# Patient Record
Sex: Male | Born: 1945 | Race: White | Hispanic: No | Marital: Married | Smoking: Former smoker
Health system: Southern US, Community
[De-identification: ages and names within clinical notes are randomized; demographics above are authoritative.]

## PROBLEM LIST (undated history)

## (undated) DIAGNOSIS — Z872 Personal history of diseases of the skin and subcutaneous tissue: Secondary | ICD-10-CM

## (undated) DIAGNOSIS — M1990 Unspecified osteoarthritis, unspecified site: Secondary | ICD-10-CM

## (undated) DIAGNOSIS — E785 Hyperlipidemia, unspecified: Secondary | ICD-10-CM

## (undated) DIAGNOSIS — R51 Headache: Secondary | ICD-10-CM

## (undated) HISTORY — DX: Headache: R51

## (undated) HISTORY — DX: Hyperlipidemia, unspecified: E78.5

## (undated) HISTORY — DX: Personal history of diseases of the skin and subcutaneous tissue: Z87.2

## (undated) HISTORY — DX: Unspecified osteoarthritis, unspecified site: M19.90

## (undated) HISTORY — PX: VASECTOMY: SHX75

---

## 2000-10-30 ENCOUNTER — Inpatient Hospital Stay: Admission: EM | Admit: 2000-10-30 | Discharge: 2000-10-31 | Payer: Self-pay | Admitting: Emergency Medicine

## 2000-10-30 ENCOUNTER — Encounter: Payer: Self-pay | Admitting: Emergency Medicine

## 2001-10-14 ENCOUNTER — Encounter: Payer: Self-pay | Admitting: Gastroenterology

## 2002-04-29 ENCOUNTER — Encounter: Admission: RE | Admit: 2002-04-29 | Discharge: 2002-04-29 | Payer: Self-pay | Admitting: Family Medicine

## 2002-04-29 ENCOUNTER — Encounter: Payer: Self-pay | Admitting: Family Medicine

## 2006-12-29 ENCOUNTER — Ambulatory Visit: Payer: Self-pay | Admitting: Gastroenterology

## 2007-01-05 ENCOUNTER — Encounter: Payer: Self-pay | Admitting: Gastroenterology

## 2007-01-05 ENCOUNTER — Ambulatory Visit: Payer: Self-pay | Admitting: Gastroenterology

## 2010-02-26 ENCOUNTER — Encounter: Payer: Self-pay | Admitting: Gastroenterology

## 2010-03-05 ENCOUNTER — Encounter (INDEPENDENT_AMBULATORY_CARE_PROVIDER_SITE_OTHER): Payer: Self-pay | Admitting: *Deleted

## 2010-04-25 ENCOUNTER — Ambulatory Visit: Payer: Self-pay | Admitting: Gastroenterology

## 2010-04-25 DIAGNOSIS — Z860100 Personal history of colon polyps, unspecified: Secondary | ICD-10-CM | POA: Insufficient documentation

## 2010-04-25 DIAGNOSIS — R195 Other fecal abnormalities: Secondary | ICD-10-CM

## 2010-04-26 ENCOUNTER — Ambulatory Visit: Payer: Self-pay | Admitting: Gastroenterology

## 2010-09-18 NOTE — Letter (Signed)
Summary: [facility] Instructions  [facility]  [address] [place], [place] [address]   Phone: [phone]  Fax: [phone]       MILEN LENGACHER    Apr 10, 1946    MRN: [id number]        Procedure Day /Date:Thursday September 8th, 2011     Arrival Time: 8:30am      Procedure Time: 9:30am     Location of Procedure:                    _x _  [facility] (4th Floor)                        PREPARATION FOR COLONOSCOPY WITH MOVIPREP    THE DAY BEFORE YOUR PROCEDURE         DATE: 04/25/10  DAY: Today  1.  Drink clear liquids the entire day-NO SOLID FOOD  2.  Do not drink anything colored red or purple.  Avoid juices with pulp.  No orange juice.  3.  Drink at least 64 oz. (8 glasses) of fluid/clear liquids during the day to prevent dehydration and help the prep work efficiently.  CLEAR LIQUIDS INCLUDE: Water Jello Ice Popsicles Tea (sugar ok, no milk/cream) Powdered fruit flavored drinks Coffee (sugar ok, no milk/cream) Gatorade Juice: apple, white grape, white cranberry  Lemonade Clear bullion, consomm, broth Carbonated beverages (any kind) Strained chicken noodle soup Hard Candy                             4.  In the morning, mix first dose of MoviPrep solution:    Empty 1 Pouch A and 1 Pouch B into the disposable container    Add lukewarm drinking water to the top line of the container. Mix to dissolve    Refrigerate (mixed solution should be used within 24 hrs)  5.  Begin drinking the prep at 5:00 p.m. The MoviPrep container is divided by 4 marks.   Every 15 minutes drink the solution down to the next mark (approximately 8 oz) until the full liter is complete.   6.  Follow completed prep with 16 oz of clear liquid of your choice (Nothing red or purple).  Continue to drink clear liquids until bedtime.  7.  Before going to bed, mix second dose of MoviPrep solution:    Empty 1 Pouch A and 1 Pouch B into the disposable container    Add  lukewarm drinking water to the top line of the container. Mix to dissolve    Refrigerate  THE DAY OF YOUR PROCEDURE      DATE: 04/26/10 DAY: Thursday  Beginning at 4:30 a.m. (5 hours before procedure):         1. Every 15 minutes, drink the solution down to the next mark (approx 8 oz) until the full liter is complete.  2. Follow completed prep with 16 oz. of clear liquid of your choice.    3. You may drink clear liquids until 7:30am  (2 HOURS BEFORE PROCEDURE).   MEDICATION INSTRUCTIONS  Unless otherwise instructed, you should take regular prescription medications with a small sip of water   as early as possible the morning of your procedure.         OTHER INSTRUCTIONS  You will need a responsible adult at least 65 years of age to accompany you and drive you home.   This  person must remain in the waiting room during your procedure.  Wear loose fitting clothing that is easily removed.  Leave jewelry and other valuables at home.  However, you may wish to bring a book to read or  an iPod/MP3 player to listen to music as you wait for your procedure to start.  Remove all body piercing jewelry and leave at home.  Total time from sign-in until discharge is approximately 2-3 hours.  You should go home directly after your procedure and rest.  You can resume normal activities the  day after your procedure.  The day of your procedure you should not:   Drive   Make legal decisions   Operate machinery   Drink alcohol   Return to work  You will receive specific instructions about eating, activities and medications before you leave.    The above instructions have been reviewed and explained to me by   Marchelle Folks.     I fully understand and can verbalize these instructions _____________________________ Date _________

## 2010-09-18 NOTE — Procedures (Signed)
Summary: Colonoscopy  Patient: Scott Ayala Note: All result statuses are Final unless otherwise noted.  Tests: (1) Colonoscopy (COL)   COL Colonoscopy           DONE     [facility]     [address]     [place], [place]  [address]           COLONOSCOPY PROCEDURE REPORT           PATIENT:  [name], [name]  MR#:  [id number]     BIRTHDATE:  February 14, 1946, 63 yrs. old  GENDER:  male     ENDOSCOPIST:  Judie Petit T. Russella Dar, MD, [facility]           PROCEDURE DATE:  04/26/2010     PROCEDURE:  Colonoscopy [id number]     ASA CLASS:  Class II     INDICATIONS:  1) FOBT positive stool  2) follow-up of polyp,     adenomatous polyp, 12/2006.     MEDICATIONS:   Fentanyl 75 mcg IV, Versed 7 mg IV     DESCRIPTION OF PROCEDURE:   After the risks benefits and     alternatives of the procedure were thoroughly explained, informed     consent was obtained.  Digital rectal exam was performed and     revealed no abnormalities.   The LB [id number]-[id number] endoscope     was introduced through the anus and advanced to the cecum, which     was identified by both the appendix and ileocecal valve, without     limitations.  The quality of the prep was excellent, using     MoviPrep.  The instrument was then slowly withdrawn as the colon     was fully examined.     <<PROCEDUREIMAGES>>     FINDINGS:  Mild diverticulosis was found in the sigmoid colon.  A     normal appearing cecum, ileocecal valve, and appendiceal orifice     were identified. The ascending, hepatic flexure, transverse,     splenic flexure, descending colon, and rectum appeared     unremarkable. Retroflexed views in the rectum revealed internal     hemorrhoids, small.  The time to cecum =  3.25  minutes. The scope     was then withdrawn (time =  9  min) from the patient and the     procedure completed.     COMPLICATIONS:  None           ENDOSCOPIC IMPRESSION:     1) Mild diverticulosis in the sigmoid colon     2) Internal hemorrhoids        RECOMMENDATIONS:     1) High fiber diet with liberal fluid intake.     2) Repeat Colonoscopy in 5 years.           Venita Lick. Russella Dar, MD, Clementeen Graham           CC: [name], MD           n.     Rosalie DoctorVenita Lick. Stark at 04/26/2010 10:16 AM           Mattheu, [place], [phone]  Note: An exclamation mark (!) indicates a result that was not dispersed into the flowsheet. Document Creation Date: 04/26/2010 10:17 AM _______________________________________________________________________  (1) Order result status: Final Collection or observation date-time: 04/26/2010 10:13 Requested date-time:  Receipt date-time:  Reported date-time:  Referring Physician:   Ordering Physician: Claudette Head [phone]) Specimen Source:  Source: Launa Grill Order Number: [id number] Lab site:   Appended Document: Colonoscopy    Clinical Lists Changes  Observations: Added new observation of COLONNXTDUE: 04/2015 (04/26/2010 13:05)

## 2010-09-18 NOTE — Assessment & Plan Note (Signed)
Summary: hemo pos/lk   History of Present Illness Visit Type: Initial Consult Primary GI MD: Elie Goody MD [facility] Primary Provider: Elnoria Howard, MD Requesting Provider: [name], MD Chief Complaint: Hem positive stool History of Present Illness:   This is a 65 year old male with a prior history of adenomatous colon polyps. Last colonoscopy in May 2008. He also has diverticulosis and hemorrhoids. Recently was found to have heme + stool on a digital rectal. He has no ongoing gastrointestinal complaints.   GI Review of Systems      Denies abdominal pain, acid reflux, belching, bloating, chest pain, dysphagia with liquids, dysphagia with solids, heartburn, loss of appetite, nausea, vomiting, vomiting blood, weight loss, and  weight gain.      Reports diarrhea, diverticulosis, heme positive stool, and  hemorrhoids.     Denies anal fissure, black tarry stools, change in bowel habit, constipation, fecal incontinence, irritable bowel syndrome, jaundice, light color stool, liver problems, rectal bleeding, and  rectal pain.   Current Medications (verified): 1)  Proair Hfa 108 (90 Base) Mcg/act Aers (Albuterol Sulfate) .... Once Daily 2)  Red Yeast Rice 600 Mg Tabs (Red Yeast Rice Extract) .... 2 Tablets By Mouth Once Daily 3)  Pulmicort Flexhaler 180 Mcg/act Aepb (Budesonide) .... 2 Puffs Every 4 Hours As Needed  Allergies (verified): 1)  ! 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 Vasectomy Tonsillectomy  Family History: Family History of Colon Cancer:MGF, Maternal Aunts Family History of Heart Disease: Father ,Brothers x2, Mother  Social History: Reviewed history from 04/20/2010 and no changes required. Married Clinical research associate Patient  has never smoked.  Alcohol Use - yes Illicit Drug Use - no Patient gets regular exercise. Daily Caffeine Use 4-6  Review of Systems       The patient complains of arthritis/joint pain and shortness of breath.         The pertinent positives and negatives are noted as above and in the HPI. All other ROS were reviewed and were negative.   Vital Signs:  Patient profile:   65 year old male Height:      68 inches Weight:      203.25 pounds BMI:     31.02 Pulse rate:   72 / minute Pulse rhythm:   regular BP sitting:   108 / 80  (left arm) Cuff size:   regular  Vitals Entered By: June McMurray CMA Duncan Dull) (April 25, 2010 9:40 AM)  Physical Exam  General:  Well developed, well nourished, no acute distress. Head:  Normocephalic and atraumatic. Eyes:  PERRLA, no icterus. Ears:  Normal auditory acuity. Mouth:  No deformity or lesions, dentition normal. Neck:  Supple; no masses or thyromegaly. Chest Wall:  Symmetrical,  no deformities . Lungs:  Clear throughout to auscultation. Heart:  Regular rate and rhythm; no murmurs, rubs,  or bruits. Abdomen:  Soft, nontender and nondistended. No masses, hepatosplenomegaly or hernias noted. Normal bowel sounds. Rectal:  Normal exam.deferred until time of colonoscopy.   Msk:  Symmetrical with no gross deformities. Normal posture. Pulses:  Normal pulses noted. Extremities:  No clubbing, cyanosis, edema or deformities noted. Neurologic:  Alert and  oriented x4;  grossly normal neurologically. Cervical Nodes:  No significant cervical adenopathy. Inguinal Nodes:  No significant inguinal adenopathy. Psych:  Alert and cooperative. Normal mood and affect.  Impression &  Recommendations:  Problem # 1:  NONSPECIFIC ABNORMAL FINDING IN STOOL CONTENTS (ICD-792.1) Asymptomatic Hemoccult-positive stool. Rule out colorectal neoplasms, hemorrhoids. The risks, benefits and alternatives to colonoscopy with possible biopsy and possible polypectomy were  discussed with the patient and they consent to proceed. The procedure will be scheduled electively. Orders: Colonoscopy (Colon)  Problem # 2:  PERSONAL HISTORY OF COLONIC POLYPS (ICD-V12.72) Prior history of adenomatous colon polyps, initially diagnosed in 12/2006. Orders: Colonoscopy (Colon)  Patient Instructions: 1)  Colonoscopy brochure given.  2)  Pick up your prep from your pharmacy.  3)  Copy sent to : [name], MD 4)  The medication list was reviewed and reconciled.  All changed / newly prescribed medications were explained.  A complete medication list was provided to the patient / caregiver.  Prescriptions: MOVIPREP 100 GM  SOLR (PEG-KCL-NACL-NASULF-NA ASC-C) As per prep instructions.  #1 x 0   Entered by:   Christie Nottingham CMA (AAMA)   Authorized by:   Meryl Dare MD [facility]   Signed by:   Meryl Dare MD [facility] on 04/25/2010   Method used:   Electronically to        [facility]  [address] [phone]* (retail)       [address]       [place], [place]  [address]       Ph: [phone] or [phone]       Fax: [phone]   RxID:   [id number]

## 2010-09-18 NOTE — Procedures (Signed)
Summary: Colonoscopy and biopsy   Colonoscopy  Procedure date:  01/05/2007  Findings:      Results: Hemorrhoids.     Results: Diverticulosis.       Pathology:  Adenomatous polyp.        Location:  [facility].    Procedures Next Due Date:    Colonoscopy: 01/2012 Patient Name: Scott, Ayala MRN:  Procedure Procedures: Colonoscopy CPT: [phone].    with biopsy. CPT: [id number].  Personnel: Endoscopist: Venita Lick. Russella Dar, MD, Clementeen Graham.  Exam Location: Exam performed in Outpatient Clinic. Outpatient  Patient Consent: Procedure, Alternatives, Risks and Benefits discussed, consent obtained, from patient. Consent was obtained by the RN.  Indications  Increased Risk Screening: For family history of colorectal neoplasia, in   Comments: MGM and several M aunts with colon cancer. History  Current Medications: Patient is not currently taking Coumadin.  Pre-Exam Physical: Performed Jan 05, 2007. Cardio-pulmonary exam, Rectal exam, HEENT exam , Abdominal exam, Mental status exam WNL.  Comments: Pt. history reviewed/updated, physical exam performed prior to initiation of sedation?Yes Exam Exam: Extent of exam reached: Cecum, extent intended: Cecum.  The cecum was identified by appendiceal orifice and IC valve. Time to Cecum: 00:05. Time for Withdrawl: 00:09:01. Colon retroflexion performed. ASA Classification: II. Tolerance: excellent.  Monitoring: Pulse and BP monitoring, Oximetry used. Supplemental O2 given.  Colon Prep Used MoviPrep for colon prep. Prep results: excellent.  Sedation Meds: Patient assessed and found to be appropriate for moderate (conscious) sedation. Fentanyl 75 mcg. given IV. Versed 8 mg. given IV.  Findings NORMAL EXAM: Hepatic Flexure.  NORMAL EXAM: Splenic Flexure to Descending Colon.  POLYP: Transverse Colon, Maximum size: 2 mm. sessile polyp. Procedure:  biopsy without cautery, removed, retrieved, Polyp sent to pathology. ICD9: Colon Polyps:  211.3.  DIVERTICULOSIS: Ascending Colon. Not bleeding. ICD9: Diverticulosis: 562.10.  - DIVERTICULOSIS: Sigmoid Colon. Not bleeding. ICD9: Diverticulosis: 562.10.  NORMAL EXAM: Cecum.  HEMORRHOIDS: Internal. Size: Small. Not bleeding. Not thrombosed. ICD9: Hemorrhoids, Internal: 455.0.   Assessment  Diagnoses: 562.10: Diverticulosis.  211.3: Colon Polyps.  455.0: Hemorrhoids, Internal.   Events  Unplanned Interventions: No intervention was required.  Unplanned Events: There were no complications. Plans  Post Exam Instructions: Post sedation instructions given.  Medication Plan: Await pathology.  Patient Education: Patient given standard instructions for: Polyps. Diverticulosis. Hemorrhoids.  Disposition: After procedure patient sent to recovery. After recovery patient sent home.  Scheduling/Referral: Colonoscopy, to [facility] T. Russella Dar, MD, [facility], around Jan 05, 2012.    This report was created from the original endoscopy report, which was reviewed and signed by the above listed endoscopist.    cc: Mosetta Putt, MD          SP-Surgical Pathology - STATUS: Final  .                                         Perform Date: [id number] 00:01  Ordered By: Rica Records Date: [id number] 14:27  Facility: [facility]                               Department: CPATH  Service Report Text  [facility]   [address] [address]   [place], [place] [address]   Telephone [phone] or [phone] Fax [phone]  REPORT OF SURGICAL PATHOLOGY    Case #: [id number]   Patient Name: Scott Ayala, Scott Ayala.   Office Chart Number: [id number]    MRN: [id number]   Pathologist: Clearance Coots, MD   DOB/Age February 12, 1946 (Age: 65) Gender: M   Date Taken: 01/05/2007   Date Received: 01/05/2007    FINAL DIAGNOSIS    ***MICROSCOPIC EXAMINATION AND DIAGNOSIS***    COLON, POLYP(S), TRANSVERSE: TUBULAR ADENOMA(S). NO HIGH GRADE   DYSPLASIA OR MALIGNANCY  IDENTIFIED.    mj   Date Reported: 01/06/2007 Clearance Coots, MD   *** Electronically Signed Out By [facility] ***    Clinical information   R/O adenomatous; family hx of colon CA, screening (gt)    specimen(s) obtained   Colon, polyp(s), transverse    Gross Description   Received in formalin are tan, soft tissue fragments that are   submitted in toto. Number: 5.   Size: Less than 0.1 cm to 0.3 cm. One block. (TB:cdc:01/05/07)     cc/   Additional Information  HL7 RESULT STATUS : F  External IF Update Timestamp : 2007-01-05:14:29:00.000000

## 2010-09-18 NOTE — Procedures (Signed)
Summary: Colonoscopy   Colonoscopy  Procedure date:  10/14/2001  Findings:      Results: Hemorrhoids.     Results: Diverticulosis.       Location:  [facility].    Procedures Next Due Date:    Colonoscopy: 10/2006 Patient Name: Scott Ayala, Scott Ayala MRN:  Procedure Procedures: Colonoscopy CPT: [phone].  Personnel: Endoscopist: Venita Lick. Russella Dar, MD, Clementeen Graham.  Referred By: Mosetta Putt, M.D.  Exam Location: Exam performed in Outpatient Clinic. Outpatient  Patient Consent: Procedure, Alternatives, Risks and Benefits discussed, consent obtained, from patient. Consent was obtained by the RN.  Indications  Increased Risk Screening: For family history of colorectal neoplasia, in  grandparent several maternal aunts  Comments: Maternal grandfather and several maternal aunts with colon ca. History  Pre-Exam Physical: Performed Oct 14, 2001. Entire physical exam was normal.  Exam Exam: Extent of exam reached: Cecum, extent intended: Cecum.  The cecum was identified by appendiceal orifice and IC valve. Colon retroflexion performed. ASA Classification: II. Tolerance: good.  Monitoring: Pulse and BP monitoring, Oximetry used. Supplemental O2 given.  Colon Prep Used Golytely for colon prep. Prep results: good.  Sedation Meds: Patient assessed and found to be appropriate for moderate (conscious) sedation. Fentanyl 100 mcg. given IV. Versed 8 mg. given IV.  Findings - DIVERTICULOSIS: Sigmoid Colon. Not bleeding. ICD9: Diverticulosis: 562.10.  NORMAL EXAM: Cecum to Descending Colon.  HEMORRHOIDS: Internal. Size: Small. Not bleeding. Not thrombosed. ICD9: Hemorrhoids, Internal: 455.0.   Assessment  Diagnoses: 562.10: Diverticulosis.  455.0: Hemorrhoids, Internal.   Events  Unplanned Interventions: No intervention was required.  Unplanned Events: There were no complications. Plans Medication Plan: Continue current medications.  Patient Education: Patient  given standard instructions for: Diverticulosis. Hemorrhoids.  Disposition: After procedure patient sent to recovery. After recovery patient sent home.  Scheduling/Referral: Colonoscopy, to [facility] T. Russella Dar, MD, [facility], around Oct 14, 2006.  Referring provider, to Mosetta Putt, M.D.,    This report was created from the original endoscopy report, which was reviewed and signed by the above listed endoscopist.    cc: Mosetta Putt, MD

## 2010-09-18 NOTE — Letter (Signed)
Summary: New Patient letter  [facility]  [address] [place], [place] [address]   Phone: [phone]  Fax: [phone]       03/05/2010 MRN: [id number]  [facility] [address] [place], [place]  [address]  Dear Scott Ayala,  Welcome to the Gastroenterology Division at [facility].    You are scheduled to see Dr.  Russella Dar  on 04/25/2010 at 9:30am on the 3rd floor at [facility], [address]  We ask that you try to arrive at our office 15 minutes prior to your appointment time to allow for check-in.  We would like you to complete the enclosed self-administered evaluation form prior to your visit and bring it with you on the day of your appointment.  We will review it with you.  Also, please bring a complete list of all your medications or, if you prefer, bring the medication bottles and we will list them.  Please bring your insurance card so that we may make a copy of it.  If your insurance requires a referral to see a specialist, please bring your referral form from your primary care physician.  Co-payments are due at the time of your visit and may be paid by cash, check or credit card.     Your office visit will consist of a consult with your physician (includes a physical exam), any laboratory testing he/she may order, scheduling of any necessary diagnostic testing (e.g. x-ray, ultrasound, CT-scan), and scheduling of a procedure (e.g. Endoscopy, Colonoscopy) if required.  Please allow enough time on your schedule to allow for any/all of these possibilities.    If you cannot keep your appointment, please call [phone] to cancel or reschedule prior to your appointment date.  This allows [place] the opportunity to schedule an appointment for another patient in need of care.  If you do not cancel or reschedule by 5 p.m. the business day prior to your appointment date, you will be charged a $50.00 late cancellation/no-show fee.    Thank you for choosing [facility] for your medical needs.  We appreciate the opportunity to care for you.  Please visit [place] at our website  to learn more about our practice.                     Sincerely,                                                             The Gastroenterology Division

## 2011-01-04 NOTE — Discharge Summary (Signed)
[facility]  Patient:    Scott Ayala, Scott Ayala                             MRN: [id number] Adm. Date:  [id number] Disc. Date: [id number] Attending:  Drema Halon                           Discharge Summary  DISCHARGE DIAGNOSES: 1. Asthmatic bronchitis possibly secondary to resolving influenza. 2. Convulsive syncope.  HISTORY OF PRESENT ILLNESS:  This 65 year old male had a three-day history of fever, chills, myalgias, and watery nasal congestion with a dry cough.  He had had some mild frontal headaches and, on the day of admission, had flown back to [place] from [place].  He had had less than a quart of liquids over a 24-hour period.  He presented to the emergency room, was in the waiting area for a couple of hours, and he was seen by the ER physician.  He was evaluated and felt to have a respiratory infection and was going to be sent home on Zithromax.  On standing him to do postural blood pressures, he became light-headed and had a brief syncopal episode of less than a minute with some evidence of tonic-clonic activity.  There were no obvious postictal symptoms. Because of the syncope, Dr. Margrett Rud was called to admit the patient.  PAST MEDICAL HISTORY:  Unremarkable and the patient was not on any medications.  PHYSICAL EXAMINATION AT ADMISSION:  VITAL SIGNS:  Temperature 99.4, blood pressure 124/70, pulse 88, respirations 14.  HEAD/NECK:  Exam was unremarkable except for rhinorrhea.  LUNGS:  Clear.  CARDIAC:  Exam was negative.  ABDOMEN:  Unremarkable.  RECTAL:  Negative with hematest-negative stool.  NEUROMUSCULAR:  Examination was unremarkable.  ADMITTING DIAGNOSIS:  Viral illness with dehydration, postural syncope, and a seizure.  LABORATORY DATA:  Admission CBC showed a hematocrit of 43.8, ______ platelets, and WBCs of 6700 with 45 polys, 44 lymphs, 11 monos, and 1 baso. BMP on admission was normal.  Total CK was elevated at  220 but CK-MB and troponins were negative.  Urinalysis was unremarkable except for minimal urobilinogen.  Blood cultures x 2 were negative.  Admission EKG was consistent with left atrial enlargement and he had nonspecific T-wave changes.  A CT brain scan without contrast was negative except for patchy ethmoid sinus disease.  A chest x-ray was unremarkable.  HOSPITAL COURSE:  Patient was admitted to telemetry which remained normal. Vital signs were obtained.  He was given clear liquids and IV fluids.  He was not given Zithromax since it was felt that he had a viral illness.  His vital signs remained stable.  His O2 saturations were good.  He did demonstrate a fever as high as 100.1 during his hospitalization.  His Is and Os were good. He was seen in neurologic consultation by Dr. Kelli Hope.  It was his impression that the patient had convulsive syncope and did not need further neurologic evaluation or treatment.  Later, on the first hospital day, the patient was improved but he was having a frequent cough.  He had no dizziness and no evidence of any seizure activity.  He was alert and oriented. Examination of his lungs revealed sparse rhonchi in the right base with a few wheezes.  The remainder of examination was unremarkable.  Impression at that point was that patient either had influenza and/or asthmatic  bronchitis and/or early right lower lobe pneumonia.  For that reason, Zithromax was initiated and he was also given Tussionex.  It was felt that he was several days into possible influenza and that treatment for that illness would not be effective for that reason.  By October 31, 2000, the patient was improved.  He was coughing frequently at times.  His chest felt congested.  He was ambulating without difficulty, his vital signs were good, and he was afebrile.  He was alert and oriented.  His respirations were not labored.  Examination of his lungs at that time revealed coarse  bibasilar rales and scattered bibasilar low-pitched wheezes.  Remainder of examination was unremarkable.  It was felt that he was stable enough to be discharged and followed as an outpatient.  DISCHARGE MEDICATIONS: 1. Z-Pak as directed. 2. Tussionex one teaspoon q.12h. p.r.n. for cough. 3. Albuterol inhaler q.4h. p.r.n. chest congestion or wheezing.  He was    instructed in its use.  CONDITION:  Satisfactory and improved.  ACTIVITY:  Up as tolerated at home.  He was cautioned regarding orthostatic dizziness.  He was advised not to work before November 03, 2000.  FOLLOW-UP:  Three to four days in the office by me. DD:  12/21/00 TD:  12/22/00 Job: [id number]

## 2011-01-04 NOTE — Consult Note (Signed)
[facility]  Patient:    Scott Ayala, Scott Ayala                             MRN: [id number] Proc. Date: 10/30/00 Adm. Date:  [id number] Attending:  Molpus, John L                          Consultation Report  DATE OF BIRTH:  Jan 10, 1946  REQUESTING PHYSICIAN:  [facility], and Margrett Rud.  REASON FOR EVALUATION:  Suspected seizure.  HISTORY OF PRESENT ILLNESS:  See initial emergency room consultation evaluation of this 65 year old man with no chronic medical problems who presented to the [facility] Emergency Room last night with a three day history of fever, cough, myalgias, and one day of anorexia and decreased p.o. intake.  He was felt to have a viral syndrome, and received symptomatic treatment.  He was prepared for discharge.  Upon attempting to walk out, complained of lightheadedness.  He was taken to the check out area in a wheelchair.  He says his blood pressure was reportedly checked again out there was low per the patient, although I cannot find this documented anywhere.  He says that they subsequently attempted to check orthostatics, and after he stood for a while he began to feel very dizzy and light-headed.  Per his wifes report, he fell back into the wheelchair, and then became stiff with twitching movements of his extremities and unresponsiveness.  He seemed to have two brief spells, each lasting less than 15 seconds.  He was confused for a few minutes afterwards, but subsequently came back to normal within about 5 minutes.  CT of head was performed, it was negative.  He was subsequently admitted for further evaluation, although he remains in the emergency room awaiting admission.  He was on IV fluids all night, and this morning says he feels much better.  He had a mild headache yesterday, which at present is better.  He denies any neck stiffness.  PAST MEDICAL HISTORY:  No chronic medical problems.  FAMILY HISTORY:  No  history of seizures.  SOCIAL HISTORY:  Does not use tobacco or alcohol.  ALLERGIES:  No known drug allergies.  MEDICATIONS:  No chronic medications.  REVIEW OF SYSTEMS:  Headache as above, no neck stiffness, no focal weakness or numbness or tingling to the extremities, no bowel or bladder symptoms.  No previous history of stroke, head injury, or encephalitis.  Otherwise, per HPI.  PHYSICAL EXAMINATION:  VITAL SIGNS:  Vital signs at 0710.  Temperature 98.7, blood pressure 108/69, pulse 67, respirations 14.  GENERAL:  He is alert and in no evident distress.  He is fully oriented and able to answer questions and follow complex commands.  His speech is fluent and not dysarthric.  CHEST:  Clear to auscultation.  HEART:  Regular rate and rhythm without murmurs.  NECK:  Supple without carotid bruits.  NEUROLOGIC EXAMINATION:  Cranial nerves, funduscopic examination is benign. Pupils are equal and briskly reactive.  Extraocular movements are normal without nystagmus.  Visual fields are full to confrontation.  Face is symmetric and moves normally.  Tongue and palate move normally in the midline. Motor exam:  Normal bulk and tone, no atrophy or fasciculations.  Normal strength in all tested extremity muscles.  Sensation is intact to light touch, vibration, and pinprick in all extremities.  Cerebellar:  Rapid alternating movements  are normal.  Finger-to-nose is performed without dysmetria. Reflexes are 2+ and symmetric.  Gait is normal.  He is able to heel-toe and tandem walk.  He does have mild difficulty with tandem, which he says is due to lightheadedness.  LABORATORY DATA:  CT of the head is personally reviewed, and I agree that it is normal.  EKG reveals no acute changes.  CBC and BMET are unremarkable.  CK is mildly elevated at 220.  IMPRESSION:  Convulsive syncope.  RECOMMENDATIONS:  No further neurologic workup needed.  Recommend only symptomatic treatment of a viral  illness.  This has no major significance in terms of future risk of seizure, and I advised him of such. DD:  10/30/00 TD:  10/30/00 Job: [id number]

## 2011-01-04 NOTE — H&P (Signed)
[facility]  Patient:    Scott Ayala, Scott Ayala                             MRN: [id number] Adm. Date:  [id number] Attending:  Molpus, Carlisle Beers CC:         Carolyne Fiscal, M.D.   History and Physical  PRIMARY CARE PHYSICIAN:  Carolyne Fiscal, M.D.  HISTORY OF PRESENT ILLNESS:  This 65 year old male had a three day history starting on Sunday of fever and chills which were intermittent along with myalgias and watery nasal congestion with dry cough. He had had some mild frontal aching and on the day of admission had flown back from [employer], [place]. He had less than one quart of liquids in the 24-hour period and went to the emergency room at 7:00. He apparently waited in the waiting area for a couple of hours and was seen by emergency room physician with a normal CBC and CMET along with a normal chest x-ray and no focal findings. He was to be discharged on Zithromax for an upper respiratory infection but on standing him up to do postural blood pressures, he felt faint and lightheaded and had a brief less than 1 minute seizure with no significant postictal period. This seizure was generalized with eye rolling and extremity stiffening. He was fine afterwards and I was called at that time to see him.  ALLERGIES:  No known drug allergies.  CURRENT MEDICATIONS:  None.  HOSPITALIZATIONS/OPERATIONS:  Tonsillectomy and adenoidectomy.  FAMILY HISTORY:  Father died of a stroke at age 28 and mother has had a CVA and is living. He has seven brothers and four sisters who are alive without significant health problems.  SOCIAL HISTORY:  No tobacco. He has not used alcohol in a month because of [place]. He is an Pensions consultant. He is married and his wife is present with him. He feels reasonably improved lying quietly and is alert and conversant in bed.  REVIEW OF SYSTEMS:  Review of systems is entirely unrevealing. He has been told in the past that he has had borderline blood pressure  and certain foods irritate his bowel, but he has no other positive review of systems. No complaints of stiff neck or focal neurologic symptoms.  PHYSICAL EXAMINATION:  VITAL SIGNS:  Temperature 99.4, blood pressure 124/70, I did not repeat orthostasis, pulse 88, respirations 14 and comfortable.  HEENT:  Tympanic membranes clear. Mucous membranes moist. No neck masses, bruit or stiffness. There is 1+ watery nasal rhinorrhea.  CARDIORESPIRATORY:  Clear to auscultation and percussion. No murmurs, lifts, gallops or rubs. PMI is not displaced.  ABDOMEN:  Soft and nontender without masses or organomegaly. Bowel sounds are normal.  RECTAL:  Guaiac negative stool, no masses.  NEUROMUSCULOSKELETAL:  Within normal limits.  IMPRESSION:  Probable viral syndrome with dehydration and postural seizure. Question of glycemic status at the time of seizure.  PLAN: 1. Admit. 2. Observe. 3. Hydrate. 4. Re-evaluate in 4 hours. 5. We discussed a lumbar puncture with the patient and his wife and    they agree with the normal CT which had been done to hold on this    until hydration accomplished. DD:  10/30/00 TD:  10/30/00 Job: [id number]

## 2011-09-05 ENCOUNTER — Encounter (INDEPENDENT_AMBULATORY_CARE_PROVIDER_SITE_OTHER): Payer: BC Managed Care – PPO | Admitting: Family Medicine

## 2011-09-05 ENCOUNTER — Encounter: Payer: Self-pay | Admitting: Family Medicine

## 2011-09-05 DIAGNOSIS — J45909 Unspecified asthma, uncomplicated: Secondary | ICD-10-CM | POA: Insufficient documentation

## 2011-09-05 DIAGNOSIS — Z0000 Encounter for general adult medical examination without abnormal findings: Secondary | ICD-10-CM

## 2011-09-05 DIAGNOSIS — E785 Hyperlipidemia, unspecified: Secondary | ICD-10-CM

## 2011-09-05 DIAGNOSIS — Z872 Personal history of diseases of the skin and subcutaneous tissue: Secondary | ICD-10-CM

## 2011-09-05 DIAGNOSIS — Z23 Encounter for immunization: Secondary | ICD-10-CM

## 2011-11-07 ENCOUNTER — Other Ambulatory Visit: Payer: Self-pay | Admitting: Family Medicine

## 2011-11-07 DIAGNOSIS — E039 Hypothyroidism, unspecified: Secondary | ICD-10-CM

## 2011-11-07 MED ORDER — ALBUTEROL SULFATE HFA 108 (90 BASE) MCG/ACT IN AERS: 2.0000 | INHALATION_SPRAY | Freq: Four times a day (QID) | RESPIRATORY_TRACT | Status: DC | PRN

## 2011-11-07 MED ORDER — BUDESONIDE 180 MCG/ACT IN AEPB: 1.0000 | INHALATION_SPRAY | Freq: Two times a day (BID) | RESPIRATORY_TRACT | Status: DC

## 2011-11-13 ENCOUNTER — Other Ambulatory Visit (INDEPENDENT_AMBULATORY_CARE_PROVIDER_SITE_OTHER): Payer: BC Managed Care – PPO

## 2011-11-13 VITALS — BP 150/90 | HR 76 | Resp 18 | Ht 68.0 in | Wt 217.0 lb

## 2011-11-13 DIAGNOSIS — E039 Hypothyroidism, unspecified: Secondary | ICD-10-CM

## 2011-11-13 LAB — T4, FREE: Free T4: 1.17 ng/dL (ref 0.80–1.80)

## 2011-11-13 LAB — T3, FREE: T3, Free: 3 pg/mL (ref 2.3–4.2)

## 2011-11-13 LAB — TSH: TSH: 4.574 u[IU]/mL — ABNORMAL HIGH (ref 0.350–4.500)

## 2011-12-03 ENCOUNTER — Other Ambulatory Visit: Payer: Self-pay | Admitting: Family Medicine

## 2012-03-10 ENCOUNTER — Other Ambulatory Visit: Payer: Self-pay | Admitting: Family Medicine

## 2012-03-10 DIAGNOSIS — R5383 Other fatigue: Secondary | ICD-10-CM

## 2012-03-10 DIAGNOSIS — E039 Hypothyroidism, unspecified: Secondary | ICD-10-CM

## 2012-03-11 ENCOUNTER — Ambulatory Visit (INDEPENDENT_AMBULATORY_CARE_PROVIDER_SITE_OTHER): Payer: BC Managed Care – PPO | Admitting: Family Medicine

## 2012-03-11 VITALS — BP 138/82 | HR 62 | Temp 98.1°F | Resp 18 | Ht 69.0 in | Wt 212.0 lb

## 2012-03-11 DIAGNOSIS — E039 Hypothyroidism, unspecified: Secondary | ICD-10-CM

## 2012-03-11 DIAGNOSIS — R5383 Other fatigue: Secondary | ICD-10-CM

## 2012-03-11 LAB — TSH: TSH: 4.589 u[IU]/mL — ABNORMAL HIGH (ref 0.350–4.500)

## 2012-03-11 NOTE — Progress Notes (Signed)
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 Was borderline about 6 months ago and he's had some continued fatigue.  Plan: TSH

## 2012-03-12 ENCOUNTER — Other Ambulatory Visit: Payer: Self-pay | Admitting: Family Medicine

## 2012-03-12 DIAGNOSIS — E039 Hypothyroidism, unspecified: Secondary | ICD-10-CM

## 2012-03-12 MED ORDER — LEVOTHYROXINE SODIUM 25 MCG PO TABS: 25.0000 ug | ORAL_TABLET | Freq: Every day | ORAL | Status: DC

## 2012-04-14 ENCOUNTER — Telehealth: Payer: Self-pay

## 2012-04-14 NOTE — Telephone Encounter (Signed)
Pt has an upcoming appointment and wants to know if labs are being drawn.  How long to fast?  What time the appointment is?   [phone]

## 2012-04-16 ENCOUNTER — Encounter: Payer: Self-pay | Admitting: Family Medicine

## 2012-04-16 ENCOUNTER — Ambulatory Visit (INDEPENDENT_AMBULATORY_CARE_PROVIDER_SITE_OTHER): Payer: BC Managed Care – PPO | Admitting: Family Medicine

## 2012-04-16 VITALS — BP 148/88 | HR 60 | Temp 98.0°F | Resp 16 | Ht 67.0 in | Wt 211.2 lb

## 2012-04-16 DIAGNOSIS — E039 Hypothyroidism, unspecified: Secondary | ICD-10-CM

## 2012-04-16 LAB — TSH: TSH: 3.171 u[IU]/mL (ref 0.350–4.500)

## 2012-04-16 NOTE — Progress Notes (Signed)
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 He was started a month ago on levothyroxin and initially felt significantly improved as far as energy goes. He's leveled off in terms of his energy now and feels that perhaps he needs the dose adjusted upward to have complete the normal transition to euthyroid state  Plan: Check TSH

## 2012-04-16 NOTE — Telephone Encounter (Signed)
Spoke with patient--confirmed appt today at 11:45am for thyroid check.

## 2012-05-04 ENCOUNTER — Telehealth: Payer: Self-pay

## 2012-05-04 MED ORDER — LEVOTHYROXINE SODIUM 50 MCG PO TABS: 50.0000 ug | ORAL_TABLET | Freq: Every day | ORAL | Status: DC

## 2012-05-04 NOTE — Telephone Encounter (Signed)
PT STATES HE AND DR KURT HAD DISCUSSED INCREASING HIS LEVOTHROID FROM 25MG  TO 50MG . BUT WHEN HE HAD GONE TO THE PHARMACY, IT WAS STILL THE 25 PLEASE CALL [phone]    [facility] [address]

## 2012-05-04 NOTE — Telephone Encounter (Signed)
Dr Milus Glazier did comment on labs, this is to be increased to 50 mcg, called patient to advise.

## 2012-11-10 ENCOUNTER — Other Ambulatory Visit: Payer: Self-pay | Admitting: Family Medicine

## 2012-11-30 ENCOUNTER — Ambulatory Visit: Payer: BC Managed Care – PPO

## 2012-11-30 ENCOUNTER — Ambulatory Visit (INDEPENDENT_AMBULATORY_CARE_PROVIDER_SITE_OTHER): Payer: BC Managed Care – PPO | Admitting: Family Medicine

## 2012-11-30 VITALS — BP 166/98 | HR 78 | Temp 99.4°F | Resp 20 | Ht 69.0 in | Wt 219.6 lb

## 2012-11-30 DIAGNOSIS — R05 Cough: Secondary | ICD-10-CM

## 2012-11-30 DIAGNOSIS — Z125 Encounter for screening for malignant neoplasm of prostate: Secondary | ICD-10-CM

## 2012-11-30 DIAGNOSIS — R059 Cough, unspecified: Secondary | ICD-10-CM

## 2012-11-30 DIAGNOSIS — J45909 Unspecified asthma, uncomplicated: Secondary | ICD-10-CM

## 2012-11-30 LAB — POCT CBC
Granulocyte percent: 75.7 %G (ref 37–80)
HCT, POC: 44.3 % (ref 43.5–53.7)
Hemoglobin: 13.8 g/dL — AB (ref 14.1–18.1)
Lymph, poc: 1.4 (ref 0.6–3.4)
MCH, POC: 30 pg (ref 27–31.2)
MCHC: 31.2 g/dL — AB (ref 31.8–35.4)
MCV: 96.2 fL (ref 80–97)
MID (cbc): 0.7 (ref 0–0.9)
MPV: 8.5 fL (ref 0–99.8)
POC Granulocyte: 6.5 (ref 2–6.9)
POC LYMPH PERCENT: 16.5 %L (ref 10–50)
POC MID %: 7.8 %M (ref 0–12)
Platelet Count, POC: 309 10*3/uL (ref 142–424)
RBC: 4.6 M/uL — AB (ref 4.69–6.13)
RDW, POC: 13.8 %
WBC: 8.6 10*3/uL (ref 4.6–10.2)

## 2012-11-30 LAB — PSA: PSA: 0.65 ng/mL (ref <=4.00)

## 2012-11-30 MED ORDER — METHYLPREDNISOLONE 4 MG PO KIT: PACK | ORAL | Status: DC

## 2012-11-30 MED ORDER — BUDESONIDE 180 MCG/ACT IN AEPB: INHALATION_SPRAY | RESPIRATORY_TRACT | Status: DC

## 2012-11-30 MED ORDER — ALBUTEROL SULFATE HFA 108 (90 BASE) MCG/ACT IN AERS: INHALATION_SPRAY | RESPIRATORY_TRACT | Status: DC

## 2012-11-30 MED ORDER — AZITHROMYCIN 250 MG PO TABS: ORAL_TABLET | ORAL | Status: DC

## 2012-11-30 NOTE — Progress Notes (Signed)
  Subjective:    Patient ID: Scott Ayala, male    DOB: Dec 29, 1945, 67 y.o.   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 He reports a cough started it and it has turned into chest congestion. He has nasal congestion.  Patient has chronic asthma and has been taking his inhalers. He is about to run out of his prescriptions however. Patient has had some sweats and chills he has a low-grade temperature. He's not particularly short of breath but he does have burning in his substernal area and his muscles are sore from coughing. Aref patient has a history of hospitalization for pneumonia in the past.  The patient also brings in lab results from last February done at work. The only abnormal test was a total cholesterol of 217. His CBC and chemistry profiles were normal  Review of Systems No hemoptysis    Objective:   Physical Exam Patient appears to be mildly ill with respiratory symptoms, raspy voice, and sinus congestion HEENT: Unremarkable except for mucopurulent discharge in both the nostrils. Chest: Faint right lower lobe rales and diffuse expiratory wheezes Heart: Regular no murmur Skin: Few areas of actinic scaling on scalp.  Extremities: Moving easily in the room, no edema [facility] reading (PRIMARY) by  Dr. Milus Glazier  CXR- no definite infiltrate Results for orders placed in visit on 11/30/12  POCT CBC      Result Value Range   WBC 8.6  4.6 - 10.2 K/uL   Lymph, poc 1.4  0.6 - 3.4   POC LYMPH PERCENT 16.5  10 - 50 %L   MID (cbc) 0.7  0 - 0.9   POC MID % 7.8  0 - 12 %M   POC Granulocyte 6.5  2 - 6.9   Granulocyte percent 75.7  37 - 80 %G   RBC 4.60 (*) 4.69 - 6.13 M/uL   Hemoglobin 13.8 (*) 14.1 - 18.1 g/dL   HCT, POC 91.4  78.2 - 53.7 %   MCV 96.2  80 - 97 fL   MCH, POC 30.0  27 - 31.2 pg   MCHC 31.2 (*) 31.8 - 35.4 g/dL   RDW, POC 95.6     Platelet Count, POC 309  142 -  424 K/uL   MPV 8.5  0 - 99.8 fL   .    Assessment & Plan:  Acute bronchitis compounded by an asthmatic exacerbation  Plan: Z-Pak, Medrol Dosepak, and continue the inhalers

## 2012-12-09 ENCOUNTER — Other Ambulatory Visit: Payer: Self-pay | Admitting: Physician Assistant

## 2012-12-09 NOTE — Telephone Encounter (Signed)
Pt is calling about his Thyroid prescription. If someone could give him a call at [phone]

## 2012-12-31 ENCOUNTER — Telehealth: Payer: Self-pay

## 2012-12-31 DIAGNOSIS — J45909 Unspecified asthma, uncomplicated: Secondary | ICD-10-CM

## 2012-12-31 MED ORDER — ALBUTEROL SULFATE HFA 108 (90 BASE) MCG/ACT IN AERS: INHALATION_SPRAY | RESPIRATORY_TRACT | Status: DC

## 2012-12-31 NOTE — Telephone Encounter (Signed)
Sent to the pharmacy.  If pt continues to need an inhaler a month he will need a recheck.

## 2012-12-31 NOTE — Telephone Encounter (Signed)
Pt is requesting a refill on albuterol. Best# [phone]

## 2012-12-31 NOTE — Telephone Encounter (Signed)
Pended please advise.  

## 2013-03-09 ENCOUNTER — Other Ambulatory Visit: Payer: Self-pay | Admitting: Dermatology

## 2013-05-10 ENCOUNTER — Other Ambulatory Visit: Payer: Self-pay | Admitting: Family Medicine

## 2013-05-30 ENCOUNTER — Other Ambulatory Visit: Payer: Self-pay | Admitting: Family Medicine

## 2013-06-27 ENCOUNTER — Other Ambulatory Visit: Payer: Self-pay | Admitting: Physician Assistant

## 2013-07-29 ENCOUNTER — Ambulatory Visit (INDEPENDENT_AMBULATORY_CARE_PROVIDER_SITE_OTHER): Payer: BC Managed Care – PPO | Admitting: Family Medicine

## 2013-07-29 ENCOUNTER — Encounter: Payer: Self-pay | Admitting: Family Medicine

## 2013-07-29 VITALS — BP 138/94 | HR 74 | Temp 98.6°F | Resp 16 | Ht 68.0 in | Wt 220.4 lb

## 2013-07-29 DIAGNOSIS — Z0000 Encounter for general adult medical examination without abnormal findings: Secondary | ICD-10-CM

## 2013-07-29 DIAGNOSIS — Z23 Encounter for immunization: Secondary | ICD-10-CM

## 2013-07-29 DIAGNOSIS — R198 Other specified symptoms and signs involving the digestive system and abdomen: Secondary | ICD-10-CM

## 2013-07-29 DIAGNOSIS — J45909 Unspecified asthma, uncomplicated: Secondary | ICD-10-CM

## 2013-07-29 DIAGNOSIS — E039 Hypothyroidism, unspecified: Secondary | ICD-10-CM

## 2013-07-29 LAB — POCT URINALYSIS DIPSTICK
Bilirubin, UA: NEGATIVE
Glucose, UA: NEGATIVE
Ketones, UA: NEGATIVE
Leukocytes, UA: NEGATIVE
Nitrite, UA: NEGATIVE
Protein, UA: NEGATIVE
Spec Grav, UA: 1.02
Urobilinogen, UA: 0.2
pH, UA: 7

## 2013-07-29 LAB — COMPREHENSIVE METABOLIC PANEL
ALT: 14 U/L (ref 0–53)
AST: 22 U/L (ref 0–37)
Albumin: 4.3 g/dL (ref 3.5–5.2)
Alkaline Phosphatase: 19 U/L — ABNORMAL LOW (ref 39–117)
BUN: 12 mg/dL (ref 6–23)
CO2: 24 mEq/L (ref 19–32)
Calcium: 9.5 mg/dL (ref 8.4–10.5)
Chloride: 103 mEq/L (ref 96–112)
Creat: 0.84 mg/dL (ref 0.50–1.35)
Glucose, Bld: 85 mg/dL (ref 70–99)
Potassium: 4.3 mEq/L (ref 3.5–5.3)
Sodium: 136 mEq/L (ref 135–145)
Total Bilirubin: 0.6 mg/dL (ref 0.3–1.2)
Total Protein: 7 g/dL (ref 6.0–8.3)

## 2013-07-29 LAB — LIPID PANEL
Cholesterol: 240 mg/dL — ABNORMAL HIGH (ref 0–200)
HDL: 89 mg/dL (ref >39)
LDL Cholesterol: 133 mg/dL — ABNORMAL HIGH (ref 0–99)
Total CHOL/HDL Ratio: 2.7 Ratio
Triglycerides: 88 mg/dL (ref <150)
VLDL: 18 mg/dL (ref 0–40)

## 2013-07-29 LAB — TSH: TSH: 3.108 u[IU]/mL (ref 0.350–4.500)

## 2013-07-29 LAB — CBC
HCT: 43.3 % (ref 39.0–52.0)
Hemoglobin: 15.4 g/dL (ref 13.0–17.0)
MCH: 31.3 pg (ref 26.0–34.0)
MCHC: 35.6 g/dL (ref 30.0–36.0)
MCV: 88 fL (ref 78.0–100.0)
Platelets: 384 10*3/uL (ref 150–400)
RBC: 4.92 MIL/uL (ref 4.22–5.81)
RDW: 13.4 % (ref 11.5–15.5)
WBC: 5.9 10*3/uL (ref 4.0–10.5)

## 2013-07-29 LAB — IFOBT (OCCULT BLOOD): IFOBT: POSITIVE

## 2013-07-29 MED ORDER — LEVOTHYROXINE SODIUM 50 MCG PO TABS: 50.0000 ug | ORAL_TABLET | Freq: Every day | ORAL | Status: DC

## 2013-07-29 MED ORDER — BUDESONIDE 180 MCG/ACT IN AEPB: INHALATION_SPRAY | RESPIRATORY_TRACT | Status: DC

## 2013-07-29 MED ORDER — ALBUTEROL SULFATE HFA 108 (90 BASE) MCG/ACT IN AERS: 2.0000 | INHALATION_SPRAY | Freq: Four times a day (QID) | RESPIRATORY_TRACT | Status: AC | PRN

## 2013-07-29 MED ORDER — MOMETASONE FUROATE 50 MCG/ACT NA SUSP: 2.0000 | Freq: Every day | NASAL | Status: DC

## 2013-07-29 NOTE — Progress Notes (Signed)
   Subjective:    Patient ID: Scott Ayala, male    DOB: 10-10-1945, 67 y.o.   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   HENT:       Throat clearing   Eyes: Negative.   Respiratory: Positive for shortness of breath and wheezing.   Cardiovascular: Negative.   Gastrointestinal: Negative.   Endocrine: Negative.   Genitourinary: Negative.   Musculoskeletal: Positive for arthralgias and back pain.  Allergic/Immunologic: Negative.   Neurological: Negative.   Hematological: Negative.   Psychiatric/Behavioral: Negative.        Objective:   Physical Exam140/90 NAD HEENT:  Mild optic cupping, otherwise negative Neck:  No bruit, no thyromegaly, no adenopathy Chest:  Clear Heart:  Normal rate, no murmur, no gallop Abdomen: soft, mild discomfort epigastrium, mid abdominal fullness, no bruit, no HSM Genitalia: normal Rectal:  Normal prostate, no hemorrhoids Ext: no edema, good pulses Skin: normal No axillary or inguinal adenopathy    Assessment & Plan:  Annual physical exam - Plan: CBC, Comprehensive metabolic panel, Lipid panel, TSH, PSA, POCT urinalysis dipstick, IFOBT POC (occult bld, rslt in office)  Need for prophylactic vaccination and inoculation against influenza - Plan: Flu Vaccine QUAD 36+ mos IM  Asthma, chronic - Plan: budesonide (PULMICORT FLEXHALER) 180 MCG/ACT inhaler, albuterol (VENTOLIN HFA) 108 (90 BASE) MCG/ACT inhaler, mometasone (NASONEX) 50 MCG/ACT nasal spray  Unspecified hypothyroidism - Plan: levothyroxine (SYNTHROID, LEVOTHROID) 50 MCG tablet  Abdominal fullness - Plan: US Abdomen Complete  Signed, [name], MD

## 2013-07-29 NOTE — Patient Instructions (Signed)
Health Maintenance, Males A healthy lifestyle and preventative care can promote health and wellness.  Maintain regular health, dental, and eye exams.  Eat a healthy diet. Foods like vegetables, fruits, whole grains, low-fat dairy products, and lean protein foods contain the nutrients you need without too many calories. Decrease your intake of foods high in solid fats, added sugars, and salt. Get information about a proper diet from your caregiver, if necessary.  Regular physical exercise is one of the most important things you can do for your health. Most adults should get at least 150 minutes of moderate-intensity exercise (any activity that increases your heart rate and causes you to sweat) each week. In addition, most adults need muscle-strengthening exercises on 2 or more days a week.   Maintain a healthy weight. The body mass index (BMI) is a screening tool to identify possible weight problems. It provides an estimate of body fat based on height and weight. Your caregiver can help determine your BMI, and can help you achieve or maintain a healthy weight. For adults 20 years and older:  A BMI below 18.5 is considered underweight.  A BMI of 18.5 to 24.9 is normal.  A BMI of 25 to 29.9 is considered overweight.  A BMI of 30 and above is considered obese.  Maintain normal blood lipids and cholesterol by exercising and minimizing your intake of saturated fat. Eat a balanced diet with plenty of fruits and vegetables. Blood tests for lipids and cholesterol should begin at age 20 and be repeated every 5 years. If your lipid or cholesterol levels are high, you are over 50, or you are a high risk for heart disease, you may need your cholesterol levels checked more frequently.Ongoing high lipid and cholesterol levels should be treated with medicines, if diet and exercise are not effective.  If you smoke, find out from your caregiver how to quit. If you do not use tobacco, do not start.  Lung  cancer screening is recommended for adults aged 55 80 years who are at high risk for developing lung cancer because of a history of smoking. Yearly low-dose computed tomography (CT) is recommended for people who have at least a 30-pack-year history of smoking and are a current smoker or have quit within the past 15 years. A pack year of smoking is smoking an average of 1 pack of cigarettes a day for 1 year (for example: 1 pack a day for 30 years or 2 packs a day for 15 years). Yearly screening should continue until the smoker has stopped smoking for at least 15 years. Yearly screening should also be stopped for people who develop a health problem that would prevent them from having lung cancer treatment.  If you choose to drink alcohol, do not exceed 2 drinks per day. One drink is considered to be 12 ounces (355 mL) of beer, 5 ounces (148 mL) of wine, or 1.5 ounces (44 mL) of liquor.  Avoid use of street drugs. Do not share needles with anyone. Ask for help if you need support or instructions about stopping the use of drugs.  High blood pressure causes heart disease and increases the risk of stroke. Blood pressure should be checked at least every 1 to 2 years. Ongoing high blood pressure should be treated with medicines if weight loss and exercise are not effective.  If you are 45 to 67 years old, ask your caregiver if you should take aspirin to prevent heart disease.  Diabetes screening involves taking a blood   sample to check your fasting blood sugar level. This should be done once every 3 years, after age 45, if you are within normal weight and without risk factors for diabetes. Testing should be considered at a younger age or be carried out more frequently if you are overweight and have at least 1 risk factor for diabetes.  Colorectal cancer can be detected and often prevented. Most routine colorectal cancer screening begins at the age of 50 and continues through age 75. However, your caregiver may  recommend screening at an earlier age if you have risk factors for colon cancer. On a yearly basis, your caregiver may provide home test kits to check for hidden blood in the stool. Use of a small camera at the end of a tube, to directly examine the colon (sigmoidoscopy or colonoscopy), can detect the earliest forms of colorectal cancer. Talk to your caregiver about this at age 50, when routine screening begins. Direct examination of the colon should be repeated every 5 to 10 years through age 75, unless early forms of pre-cancerous polyps or small growths are found.  Hepatitis C blood testing is recommended for all people born from 1945 through 1965 and any individual with known risks for hepatitis C.  Healthy men should no longer receive prostate-specific antigen (PSA) blood tests as part of routine cancer screening. Consult with your caregiver about prostate cancer screening.  Testicular cancer screening is not recommended for adolescents or adult males who have no symptoms. Screening includes self-exam, caregiver exam, and other screening tests. Consult with your caregiver about any symptoms you have or any concerns you have about testicular cancer.  Practice safe sex. Use condoms and avoid high-risk sexual practices to reduce the spread of sexually transmitted infections (STIs).  Use sunscreen. Apply sunscreen liberally and repeatedly throughout the day. You should seek shade when your shadow is shorter than you. Protect yourself by wearing long sleeves, pants, a wide-brimmed hat, and sunglasses year round, whenever you are outdoors.  Notify your caregiver of new moles or changes in moles, especially if there is a change in shape or color. Also notify your caregiver if a mole is larger than the size of a pencil eraser.  A one-time screening for abdominal aortic aneurysm (AAA) and surgical repair of large AAAs by sound wave imaging (ultrasonography) is recommended for ages 65 to 75 years who are  current or former smokers.  Stay current with your immunizations. Document Released: 02/01/2008 Document Revised: 11/30/2012 Document Reviewed: 12/31/2010 [employer] Patient Information 2014 [employer].  

## 2013-07-30 LAB — PSA: PSA: 1.24 ng/mL (ref <=4.00)

## 2013-08-06 ENCOUNTER — Ambulatory Visit
Admission: RE | Admit: 2013-08-06 | Discharge: 2013-08-06 | Disposition: A | Discharge status: Home / Self Care | Payer: BC Managed Care – PPO | Source: Ambulatory Visit | Attending: Family Medicine | Admitting: Family Medicine

## 2013-08-06 DIAGNOSIS — R198 Other specified symptoms and signs involving the digestive system and abdomen: Secondary | ICD-10-CM

## 2013-09-27 ENCOUNTER — Inpatient Hospital Stay (HOSPITAL_COMMUNITY)
Admission: EM | Admit: 2013-09-27 | Discharge: 2013-09-28 | DRG: 069 | Disposition: A | Discharge status: Home / Self Care | Payer: BC Managed Care – PPO | Attending: Internal Medicine | Admitting: Internal Medicine

## 2013-09-27 ENCOUNTER — Inpatient Hospital Stay (HOSPITAL_COMMUNITY): Payer: BC Managed Care – PPO

## 2013-09-27 ENCOUNTER — Encounter (HOSPITAL_COMMUNITY): Payer: Self-pay | Admitting: Emergency Medicine

## 2013-09-27 ENCOUNTER — Emergency Department (HOSPITAL_COMMUNITY): Payer: BC Managed Care – PPO

## 2013-09-27 DIAGNOSIS — I6523 Occlusion and stenosis of bilateral carotid arteries: Secondary | ICD-10-CM | POA: Diagnosis present

## 2013-09-27 DIAGNOSIS — Z872 Personal history of diseases of the skin and subcutaneous tissue: Secondary | ICD-10-CM

## 2013-09-27 DIAGNOSIS — J45909 Unspecified asthma, uncomplicated: Secondary | ICD-10-CM | POA: Diagnosis present

## 2013-09-27 DIAGNOSIS — E785 Hyperlipidemia, unspecified: Secondary | ICD-10-CM | POA: Diagnosis present

## 2013-09-27 DIAGNOSIS — H5319 Other subjective visual disturbances: Secondary | ICD-10-CM | POA: Diagnosis present

## 2013-09-27 DIAGNOSIS — E039 Hypothyroidism, unspecified: Secondary | ICD-10-CM | POA: Diagnosis present

## 2013-09-27 DIAGNOSIS — I10 Essential (primary) hypertension: Secondary | ICD-10-CM | POA: Diagnosis present

## 2013-09-27 DIAGNOSIS — Z86010 Personal history of colonic polyps: Secondary | ICD-10-CM

## 2013-09-27 DIAGNOSIS — G459 Transient cerebral ischemic attack, unspecified: Principal | ICD-10-CM | POA: Diagnosis present

## 2013-09-27 LAB — CBC WITH DIFFERENTIAL/PLATELET
BASOS ABS: 0 10*3/uL (ref 0.0–0.1)
BASOS PCT: 0 % (ref 0–1)
EOS ABS: 0.2 10*3/uL (ref 0.0–0.7)
Eosinophils Relative: 2 % (ref 0–5)
HEMATOCRIT: 43.4 % (ref 39.0–52.0)
Hemoglobin: 15.2 g/dL (ref 13.0–17.0)
LYMPHS PCT: 31 % (ref 12–46)
Lymphs Abs: 2.3 10*3/uL (ref 0.7–4.0)
MCH: 32.3 pg (ref 26.0–34.0)
MCHC: 35 g/dL (ref 30.0–36.0)
MCV: 92.3 fL (ref 78.0–100.0)
MONO ABS: 0.9 10*3/uL (ref 0.1–1.0)
Monocytes Relative: 13 % — ABNORMAL HIGH (ref 3–12)
Neutro Abs: 3.9 10*3/uL (ref 1.7–7.7)
Neutrophils Relative %: 53 % (ref 43–77)
PLATELETS: 299 10*3/uL (ref 150–400)
RBC: 4.7 MIL/uL (ref 4.22–5.81)
RDW: 13.1 % (ref 11.5–15.5)
WBC: 7.3 10*3/uL (ref 4.0–10.5)

## 2013-09-27 LAB — BASIC METABOLIC PANEL
BUN: 11 mg/dL (ref 6–23)
CALCIUM: 9.1 mg/dL (ref 8.4–10.5)
CO2: 23 meq/L (ref 19–32)
CREATININE: 0.81 mg/dL (ref 0.50–1.35)
Chloride: 101 mEq/L (ref 96–112)
GFR calc Af Amer: >90 mL/min (ref >90)
GFR, EST NON AFRICAN AMERICAN: 90 mL/min — AB (ref >90)
Glucose, Bld: 91 mg/dL (ref 70–99)
Potassium: 5.3 mEq/L (ref 3.7–5.3)
SODIUM: 137 meq/L (ref 137–147)

## 2013-09-27 LAB — SEDIMENTATION RATE: SED RATE: 12 mm/h (ref 0–16)

## 2013-09-27 MED ORDER — ALBUTEROL SULFATE (2.5 MG/3ML) 0.083% IN NEBU: 3.0000 mL | INHALATION_SOLUTION | Freq: Four times a day (QID) | RESPIRATORY_TRACT | Status: DC | PRN

## 2013-09-27 MED ORDER — LEVOTHYROXINE SODIUM 50 MCG PO TABS
50.0000 ug | ORAL_TABLET | Freq: Every day | ORAL | Status: DC
  Administered 2013-09-28: 50 ug via ORAL
  Filled 2013-09-27 (×3): qty 1

## 2013-09-27 MED ORDER — ASPIRIN 81 MG PO CHEW
324.0000 mg | CHEWABLE_TABLET | Freq: Once | ORAL | Status: AC
Start: 2013-09-27 — End: 2013-09-27
  Administered 2013-09-27: 324 mg via ORAL
  Filled 2013-09-27: qty 4

## 2013-09-27 MED ORDER — FLUTICASONE PROPIONATE 50 MCG/ACT NA SUSP
1.0000 | Freq: Every day | NASAL | Status: DC
  Administered 2013-09-28: 1 via NASAL
  Filled 2013-09-27: qty 16

## 2013-09-27 MED ORDER — ENOXAPARIN SODIUM 40 MG/0.4ML ~~LOC~~ SOLN
40.0000 mg | SUBCUTANEOUS | Status: DC
  Administered 2013-09-28: 40 mg via SUBCUTANEOUS
  Filled 2013-09-27: qty 0.4

## 2013-09-27 MED ORDER — SENNOSIDES-DOCUSATE SODIUM 8.6-50 MG PO TABS: 1.0000 | ORAL_TABLET | Freq: Every evening | ORAL | Status: DC | PRN

## 2013-09-27 MED ORDER — ONDANSETRON HCL 4 MG PO TABS: 8.0000 mg | ORAL_TABLET | Freq: Three times a day (TID) | ORAL | Status: DC | PRN

## 2013-09-27 MED ORDER — ASPIRIN 81 MG PO CHEW
81.0000 mg | CHEWABLE_TABLET | Freq: Every day | ORAL | Status: DC
  Administered 2013-09-28: 81 mg via ORAL
  Filled 2013-09-27: qty 1

## 2013-09-27 MED ORDER — FLUTICASONE PROPIONATE HFA 44 MCG/ACT IN AERO
2.0000 | INHALATION_SPRAY | Freq: Two times a day (BID) | RESPIRATORY_TRACT | Status: DC
  Administered 2013-09-28: 2 via RESPIRATORY_TRACT
  Filled 2013-09-27: qty 10.6

## 2013-09-27 NOTE — ED Provider Notes (Signed)
CSN: [id number]     Arrival date & time 09/27/13  1210 History   First MD Initiated Contact with Patient 09/27/13 1821     Chief Complaint Visual disturbance  HPI: Scott Ayala is a 68 yo M with history of HLD, asthma and HTN who presents with visual disturbance for twenty minutes. He was reading on his computer screen when he noted the letters in his central vision were "scrambled." This lasted in both eyes for approximately 15 minutes, then the right eye improved followed by the left. He did not have any headache, trouble swallowing, slurred speech, extremity weakness or vertigo. He has not had symptoms similar to this in the past. He was concerned he may be having a stroke so he presents to the ED for evaluation.    Past Medical History  Diagnosis Date  . Hyperlipidemia   . Asthma   . History of actinic keratosis   . Arthritis    Past Surgical History  Procedure Laterality Date  . Vasectomy     Family History  Problem Relation Age of Onset  . Stroke Mother   . Stroke Father   . Hypertension Father   . Heart disease Father   . Heart disease Sister   . Heart disease Brother   . Heart disease Brother   . Hyperlipidemia Brother    History  Substance Use Topics  . Smoking status: Former Games developermoker  . Smokeless tobacco: Not on file  . Alcohol Use: Yes     Comment: 15 to 20 drinks per week    Review of Systems  Constitutional: Positive for fatigue. Negative for fever, chills and appetite change.  Eyes: Positive for visual disturbance. Negative for photophobia.  Respiratory: Negative for cough and shortness of breath.   Cardiovascular: Negative for chest pain and leg swelling.  Gastrointestinal: Negative for nausea, vomiting, abdominal pain, diarrhea and constipation.  Genitourinary: Negative for dysuria, frequency and decreased urine volume.  Musculoskeletal: Negative for arthralgias, back pain, gait problem and myalgias.  Skin: Negative for color change and wound.  Neurological:  Negative for dizziness, syncope, light-headedness and headaches.  Psychiatric/Behavioral: Negative for confusion and agitation.  All other systems reviewed and are negative.    Allergies  Lovastatin and Procaine hcl  Home Medications   Current Outpatient Rx  Name  Route  Sig  Dispense  Refill  . albuterol (VENTOLIN HFA) 108 (90 BASE) MCG/ACT inhaler   Inhalation   Inhale 2 puffs into the lungs every 6 (six) hours as needed for wheezing.   18 each   11   . aspirin 81 MG chewable tablet   Oral   Chew 81 mg by mouth daily.         . budesonide (PULMICORT) 180 MCG/ACT inhaler   Inhalation   Inhale 2 puffs into the lungs 2 (two) times daily.         Marland Kitchen. levothyroxine (SYNTHROID, LEVOTHROID) 50 MCG tablet   Oral   Take 1 tablet (50 mcg total) by mouth daily before breakfast.   90 tablet   3   . mometasone (NASONEX) 50 MCG/ACT nasal spray   Nasal   Place 2 sprays into the nose daily.   17 g   12   . ondansetron (ZOFRAN) 8 MG tablet   Oral   Take 8 mg by mouth every 8 (eight) hours as needed for nausea or vomiting.         . Red Yeast Rice 600 MG CAPS   Oral  Take 600 mg by mouth daily.          BP 149/89  Pulse 69  Temp(Src) 97.8 F (36.6 C) (Oral)  Resp 20  Wt 220 lb (99.791 kg)  SpO2 97% Physical Exam  Nursing note and vitals reviewed. Constitutional: He is oriented to person, place, and time. He appears well-developed and well-nourished. No distress.  HENT:  Head: Normocephalic and atraumatic.  Mouth/Throat: Oropharynx is clear and moist.  Eyes: Conjunctivae and EOM are normal. Pupils are equal, round, and reactive to light.  Neck: Normal range of motion. Neck supple.  Cardiovascular: Normal rate, regular rhythm, normal heart sounds and intact distal pulses.   Pulmonary/Chest: Effort normal and breath sounds normal. No respiratory distress.  Abdominal: Soft. Bowel sounds are normal. There is no tenderness. There is no rebound and no guarding.   Musculoskeletal: Normal range of motion. He exhibits no edema and no tenderness.  Neurological: He is alert and oriented to person, place, and time. No cranial nerve deficit. Coordination normal.  CN 3-12 intact. No pronator drift. No nystagmus. Strength 5/5, DTR's 2+, normal sensation. Gait normal.   Skin: Skin is warm and dry. No rash noted.  Psychiatric: He has a normal mood and affect. His behavior is normal.    ED Course  Procedures (including critical care time) Labs Review Labs Reviewed  CBC WITH DIFFERENTIAL - Abnormal; Notable for the following:    Monocytes Relative 13 (*)    All other components within normal limits  BASIC METABOLIC PANEL - Abnormal; Notable for the following:    GFR calc non Af Amer 90 (*)    All other components within normal limits  CREATININE, SERUM - Abnormal; Notable for the following:    GFR calc non Af Amer 89 (*)    All other components within normal limits  SEDIMENTATION RATE  LIPID PANEL  CBC  HEMOGLOBIN A1C   Imaging Review Ct Head Wo Contrast  09/27/2013   CLINICAL DATA:  Blurred vision.  Recent flu.  Hyperlipidemia.  EXAM: CT HEAD WITHOUT CONTRAST  TECHNIQUE: Contiguous axial images were obtained from the base of the skull through the vertex without intravenous contrast.  COMPARISON:  None.  FINDINGS: No intracranial hemorrhage.  No CT evidence of large acute infarct. If infarct is of high clinical concern (particularly posterior fossa infarct) MR may be considered.  No intracranial mass lesion detected on this unenhanced exam.  Mild atrophy without hydrocephalus.  Orbital structures unremarkable.  Minimal mucosal thickening ethmoid sinus air cells.  IMPRESSION: No intracranial hemorrhage or CT evidence of large acute infarct. Please see above.   Electronically Signed   By: Bridgett Larsson M.D.   On: 09/27/2013 13:25   Mr Maxine Glenn Head Wo Contrast  09/27/2013   CLINICAL DATA:  Hypertension, hyperlipidemia, central vision abnormality. Word-finding  difficulties.  EXAM: MRI HEAD WITHOUT CONTRAST  MRA HEAD WITHOUT CONTRAST  TECHNIQUE: Multiplanar, multiecho pulse sequences of the brain and surrounding structures were obtained without intravenous contrast. Angiographic images of the head were obtained using MRA technique without contrast.  COMPARISON:  CT of the head September 27, 2013 at 1305 hr.  FINDINGS: MRI HEAD FINDINGS  No reduced diffusion to suggest acute ischemia. No susceptibility artifact to suggest hemorrhage.  The ventricles and sulci are normal for patient's age. Mild patchy supratentorial white matter T2 hyperintensities are less than expected for age and though nonspecific suggest sequelae of chronic small vessel ischemic disease. No midline shift, mass effect or mass lesions.  No  abnormal extra-axial fluid collections. Trace paranasal sinus mucosal thickening without air-fluid levels. Mastoid air cells are well aerated. Ocular globes and orbital contents are nonsuspicious though not tailored for evaluation. No abnormal sellar expansion. No cerebellar tonsillar ectopia. No suspicious calvarial bone marrow signal.  MRA HEAD FINDINGS  Anterior circulation: Normal flow related enhancement of the included cervical internal carotid artery, the petrous, cavernous internal carotid arteries. There is markings tapering of the left supra clinoid and internal carotid artery to level the carotid terminus. Near complete loss of the left A1 flow void with small apparent infundibulum at the A-comm junction, with mild narrowing of the left M1 segment, relatively symmetric appearance of the M2 and M3 branches. Patent anterior communicating artery with robust flow related enhancement of the anterior cerebral arteries. Ophthalmic arteries are patent with normal flow related enhancement.  Posterior circulation: Left vertebral artery is dominant, mild narrowing of the distal right vertebral artery proximal to the vertebral basilar junction without focal stenoses.  Normal flow related enhancement the basilar artery and main branch vessels. Normal flow voids enhancement posterior cerebral arteries.  No aneurysm or suspicious luminal regularity within the anterior nor posterior circulation.  IMPRESSION: MRI head: No MR findings of acute ischemia. Normal noncontrast MRI of the brain for age.  MRA head: Tapering and narrowing of the left carotid terminus, with absent left A1 segment which may reflect occlusion (favored) or, on congenital basis (robust anterior communicating artery present). Narrowed left M1 segment with normal appearance left M2 and distal branches. This suggests focal atheromatous changes without corroborative findings of thrombus/embolus.   Electronically Signed   By: Awilda Metro   On: 09/27/2013 23:37   Mr Brain Wo Contrast  09/27/2013   CLINICAL DATA:  Hypertension, hyperlipidemia, central vision abnormality. Word-finding difficulties.  EXAM: MRI HEAD WITHOUT CONTRAST  MRA HEAD WITHOUT CONTRAST  TECHNIQUE: Multiplanar, multiecho pulse sequences of the brain and surrounding structures were obtained without intravenous contrast. Angiographic images of the head were obtained using MRA technique without contrast.  COMPARISON:  CT of the head September 27, 2013 at 1305 hr.  FINDINGS: MRI HEAD FINDINGS  No reduced diffusion to suggest acute ischemia. No susceptibility artifact to suggest hemorrhage.  The ventricles and sulci are normal for patient's age. Mild patchy supratentorial white matter T2 hyperintensities are less than expected for age and though nonspecific suggest sequelae of chronic small vessel ischemic disease. No midline shift, mass effect or mass lesions.  No abnormal extra-axial fluid collections. Trace paranasal sinus mucosal thickening without air-fluid levels. Mastoid air cells are well aerated. Ocular globes and orbital contents are nonsuspicious though not tailored for evaluation. No abnormal sellar expansion. No cerebellar tonsillar  ectopia. No suspicious calvarial bone marrow signal.  MRA HEAD FINDINGS  Anterior circulation: Normal flow related enhancement of the included cervical internal carotid artery, the petrous, cavernous internal carotid arteries. There is markings tapering of the left supra clinoid and internal carotid artery to level the carotid terminus. Near complete loss of the left A1 flow void with small apparent infundibulum at the A-comm junction, with mild narrowing of the left M1 segment, relatively symmetric appearance of the M2 and M3 branches. Patent anterior communicating artery with robust flow related enhancement of the anterior cerebral arteries. Ophthalmic arteries are patent with normal flow related enhancement.  Posterior circulation: Left vertebral artery is dominant, mild narrowing of the distal right vertebral artery proximal to the vertebral basilar junction without focal stenoses. Normal flow related enhancement the basilar artery and main branch  vessels. Normal flow voids enhancement posterior cerebral arteries.  No aneurysm or suspicious luminal regularity within the anterior nor posterior circulation.  IMPRESSION: MRI head: No MR findings of acute ischemia. Normal noncontrast MRI of the brain for age.  MRA head: Tapering and narrowing of the left carotid terminus, with absent left A1 segment which may reflect occlusion (favored) or, on congenital basis (robust anterior communicating artery present). Narrowed left M1 segment with normal appearance left M2 and distal branches. This suggests focal atheromatous changes without corroborative findings of thrombus/embolus.   Electronically Signed   By: Awilda Metro   On: 09/27/2013 23:37      MDM   68 yo M with history of HLD, asthma and HTN who presents with visual disturbance for twenty minutes. Low suspicion for primary eye problem given symptoms are bilateral. He has no headache to suggest complicated migraine. No temporal pain to suggest temporal  arteritis. Concern for TIA given transient nature of symptoms. He is hypertensive but not treated given concern for TIA. ASA given in the ED. I spoke to Neurology, they evaluated the patient and felt he needed further evaluation for TIA. He remained HD stable, no recurrent symptoms. Admitted to Hospitalist service  Reviewed imaging, labs and previous medical records, utilized in MDM  Discussed case with Dr. Preston Fleeting  Clinical Impression 1. TIA      Margie Billet, MD 09/28/13 [phone]

## 2013-09-27 NOTE — ED Notes (Signed)
MD at bedside. Dr. Glick. 

## 2013-09-27 NOTE — ED Provider Notes (Signed)
68 year old male noted an episode of visual change. He noted that when looking at a computer screen, at the c and to be broken. This only affected his central vision and not his peripheral vision. Last about 15-20 minutes before resolving. It was present in either a when he closed the other eye. It seemed to clear in his right eye first. He denies any headache. Of note, he takes aspirin 81 mg daily but has not taken it for the last 5 days because of a stomach virus. He does have history of hypertension. He denies any weakness or numbness or tingling and denies any speech difficulty. On exam, he is noted to be hypertensive. Pupils equal and reactive extra movements are full. Fundi are poorly visualized but no gross changes are seen. There no carotid bruits. Lungs are clear and heart has regular rate and rhythm. Neurologic exam is completely normal at this point. 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 This should be treated functionally as a TIA and he will be admitted for a TIA workup. He'll also need outpatient referral to ophthalmology.  I saw and evaluated the patient, reviewed the resident's note and I agree with the findings and plan.  EKG Interpretation    Date/Time:  Monday September 27 2013 12:17:12 EST Ventricular Rate:  62 PR Interval:  174 QRS Duration: 84 QT Interval:  424 QTC Calculation: 430 R Axis:   8 Text Interpretation:  Normal sinus rhythm Normal ECG When compared with ECG of 10/30/2000, No significant change was found Confirmed by Preston FleetingGLICK  MD, Chaslyn Eisen ([id number]) on 09/27/2013 7:53:33 PM              Dione Boozeavid Jaqualin Serpa, MD 09/27/13 [phone]

## 2013-09-27 NOTE — ED Notes (Signed)
Patient transported to MRI 

## 2013-09-27 NOTE — ED Notes (Signed)
~   30 mins started to have vision problems  Both eyes at first and then left is worse feels faint and has had some dizziness states had the flu on Friday but was better sat and Sunday vision has cleared completely now

## 2013-09-27 NOTE — H&P (Signed)
[facility]                                                                                    Patient Demographics  Scott Ayala, is a 68 y.o. male  CSN: [id number]  MRN: [id number]  DOB - 04-May-1946  Admit Date - 09/27/2013  Outpatient Primary MD for the patient is Elvina SidleLAUENSTEIN,KURT, MD   With History of -  Past Medical History  Diagnosis Date  . Hyperlipidemia   . Asthma   . History of actinic keratosis   . Arthritis       Past Surgical History  Procedure Laterality Date  . Vasectomy      in for   Chief Complaint  Patient presents with  . Visual Field Change     HPI  Scott NethSamuel Hollman  is a 68 y.o. male, with past medical history significant for hypertension on no medications, hyperlipidemia and asthma, presenting with an episode of central vision abnormalities while he was reading on the computer, when he noticed that there is some scrambling of the words. No peripheral vision abnormalities noted. This lasted for 20 minutes. No associated headache dizziness palpitations, fever or chills. CT of the head was negative, I was called to admit the patient    Review of Systems    In addition to the HPI above,  No Fever-chills, No problems swallowing food or Liquids, No Chest pain, Cough or Shortness of Breath, No Abdominal pain, No Nausea or Vommitting, Bowel movements are regular, No Blood in stool or Urine, No dysuria, No new skin rashes or bruises, No new joints pains-aches,  No new weakness, tingling, numbness in any extremity, No recent weight gain or loss, No polyuria, polydypsia or polyphagia, No significant Mental Stressors.  A full 10 point Review of Systems was done, except as stated above, all other Review of Systems were negative.   Social History History  Substance Use Topics  . Smoking status: Former Games developermoker  . Smokeless tobacco: Not on file  . Alcohol Use: Yes     Comment: 15 to 20 drinks per week     Family History Family  History  Problem Relation Age of Onset  . Stroke Mother   . Stroke Father   . Hypertension Father   . Heart disease Father   . Heart disease Sister   . Heart disease Brother   . Heart disease Brother   . Hyperlipidemia Brother      Prior to Admission medications   Medication Sig Start Date End Date Taking? Authorizing Provider  albuterol (VENTOLIN HFA) 108 (90 BASE) MCG/ACT inhaler Inhale 2 puffs into the lungs every 6 (six) hours as needed for wheezing. 07/29/13  Yes Elvina SidleKurt Lauenstein, MD  aspirin 81 MG chewable tablet Chew 81 mg by mouth daily.   Yes Historical Provider, MD  budesonide (PULMICORT) 180 MCG/ACT inhaler Inhale 2 puffs into the lungs 2 (two) times daily.   Yes Historical Provider, MD  levothyroxine (SYNTHROID, LEVOTHROID) 50 MCG tablet Take 1 tablet (50 mcg total) by mouth daily before breakfast. 07/29/13  Yes Elvina SidleKurt Lauenstein, MD  mometasone (NASONEX) 50 MCG/ACT nasal spray Place 2 sprays into the nose  daily. 07/29/13  Yes [name], MD  ondansetron (ZOFRAN) 8 MG tablet Take 8 mg by mouth every 8 (eight) hours as needed for nausea or vomiting.   Yes Historical Provider, MD  Red Yeast Rice 600 MG CAPS Take 600 mg by mouth daily.   Yes Historical Provider, MD    Allergies  Allergen Reactions  . Lovastatin     myalgias  . Procaine Hcl     REACTION: Nausea    Physical Exam  Vitals  Blood pressure 158/88, pulse 76, temperature 99 F (37.2 C), temperature source Oral, resp. rate 19, weight 99.791 kg (220 lb), SpO2 98.00%.   1. General middle-aged white American male in no acute distress, very pleasant  2. Normal affect and insight, Not Suicidal or Homicidal, Awake Alert, Oriented X 3.  3. No F.N deficits, ALL C.Nerves Intact, Strength 5/5 all 4 extremities, Sensation intact all 4 extremities, Plantars down going.  4. Ears and Eyes appear Normal, Conjunctivae clear, PERRLA. Moist Oral Mucosa.  5. Supple Neck, No JVD, No cervical lymphadenopathy appriciated,  No Carotid Bruits.  6. Symmetrical Chest wall movement, Good air movement bilaterally, CTAB.  7. RRR, No Gallops, Rubs or Murmurs, No Parasternal Heave.  8. Positive Bowel Sounds, Abdomen Soft, Non tender, No organomegaly appriciated,No rebound -guarding or rigidity.  9.  No Cyanosis, Normal Skin Turgor, No Skin Rash or Bruise.  10. Good muscle tone,  joints appear normal , no effusions, Normal ROM.  11. No Palpable Lymph Nodes in Neck or Axillae    Data Review  CBC  Recent Labs Lab 09/27/13 1230  WBC 7.3  HGB 15.2  HCT 43.4  PLT 299  MCV 92.3  MCH 32.3  MCHC 35.0  RDW 13.1  LYMPHSABS 2.3  MONOABS 0.9  EOSABS 0.2  BASOSABS 0.0   ------------------------------------------------------------------------------------------------------------------  Chemistries   Recent Labs Lab 09/27/13 1230  NA 137  K 5.3  CL 101  CO2 23  GLUCOSE 91  BUN 11  CREATININE 0.81  CALCIUM 9.1   Imaging results:   Ct Head Wo Contrast  09/27/2013   CLINICAL DATA:  Blurred vision.  Recent flu.  Hyperlipidemia.  EXAM: CT HEAD WITHOUT CONTRAST  TECHNIQUE: Contiguous axial images were obtained from the base of the skull through the vertex without intravenous contrast.  COMPARISON:  None.  FINDINGS: No intracranial hemorrhage.  No CT evidence of large acute infarct. If infarct is of high clinical concern (particularly posterior fossa infarct) MR may be considered.  No intracranial mass lesion detected on this unenhanced exam.  Mild atrophy without hydrocephalus.  Orbital structures unremarkable.  Minimal mucosal thickening ethmoid sinus air cells.  IMPRESSION: No intracranial hemorrhage or CT evidence of large acute infarct. Please see above.   Electronically Signed   By: Bridgett Larsson M.D.   On: 09/27/2013 13:25    My personal review of EKG: Normal    Assessment & Plan  1. visual abnormalities, occipital TIA  is a concern    Check MRI of the brain    CT of the head normal     Check  echo     Aspirin    Monitor blood pressure  2. Hypertension     Not on medications     Probably needs to be started prior to discharge  3. Hyperlipidemia     Red yeast rice     DVT Prophylaxis Lovenox  AM Labs Ordered, also please review Full Orders  Family Communication: Admission, patients condition and plan  of care including tests being ordered have been discussed with the patient  who indicates understanding and agrees with the plan and Code Status.  Code Status full  Disposition Plan: Home  Time spent in minutes : 33 minutes  Condition GUARDED

## 2013-09-28 ENCOUNTER — Inpatient Hospital Stay (HOSPITAL_COMMUNITY): Payer: BC Managed Care – PPO

## 2013-09-28 DIAGNOSIS — I6529 Occlusion and stenosis of unspecified carotid artery: Secondary | ICD-10-CM

## 2013-09-28 DIAGNOSIS — H5319 Other subjective visual disturbances: Secondary | ICD-10-CM

## 2013-09-28 DIAGNOSIS — I517 Cardiomegaly: Secondary | ICD-10-CM

## 2013-09-28 DIAGNOSIS — G459 Transient cerebral ischemic attack, unspecified: Principal | ICD-10-CM

## 2013-09-28 DIAGNOSIS — I6523 Occlusion and stenosis of bilateral carotid arteries: Secondary | ICD-10-CM | POA: Diagnosis present

## 2013-09-28 DIAGNOSIS — I10 Essential (primary) hypertension: Secondary | ICD-10-CM | POA: Diagnosis present

## 2013-09-28 DIAGNOSIS — I658 Occlusion and stenosis of other precerebral arteries: Secondary | ICD-10-CM

## 2013-09-28 DIAGNOSIS — Z86010 Personal history of colonic polyps: Secondary | ICD-10-CM

## 2013-09-28 LAB — HEMOGLOBIN A1C
Hgb A1c MFr Bld: 5.3 %
Mean Plasma Glucose: 105 mg/dL

## 2013-09-28 LAB — CREATININE, SERUM
Creatinine, Ser: 0.84 mg/dL (ref 0.50–1.35)
GFR calc Af Amer: >90 mL/min
GFR calc non Af Amer: 89 mL/min — ABNORMAL LOW

## 2013-09-28 LAB — CBC
HEMATOCRIT: 40.8 % (ref 39.0–52.0)
HEMOGLOBIN: 14.2 g/dL (ref 13.0–17.0)
MCH: 31.9 pg (ref 26.0–34.0)
MCHC: 34.8 g/dL (ref 30.0–36.0)
MCV: 91.7 fL (ref 78.0–100.0)
Platelets: 320 10*3/uL (ref 150–400)
RBC: 4.45 MIL/uL (ref 4.22–5.81)
RDW: 13.1 % (ref 11.5–15.5)
WBC: 5.9 10*3/uL (ref 4.0–10.5)

## 2013-09-28 LAB — LIPID PANEL
Cholesterol: 180 mg/dL (ref 0–200)
HDL: 64 mg/dL (ref >39)
LDL Cholesterol: 97 mg/dL (ref 0–99)
TRIGLYCERIDES: 97 mg/dL (ref <150)
Total CHOL/HDL Ratio: 2.8 RATIO
VLDL: 19 mg/dL (ref 0–40)

## 2013-09-28 MED ORDER — HYDROCHLOROTHIAZIDE 25 MG PO TABS: 25.0000 mg | ORAL_TABLET | Freq: Every day | ORAL | Status: DC

## 2013-09-28 MED ORDER — SIMVASTATIN 10 MG PO TABS: 10.0000 mg | ORAL_TABLET | Freq: Every day | ORAL | Status: AC

## 2013-09-28 MED ORDER — AMLODIPINE BESYLATE 5 MG PO TABS
5.0000 mg | ORAL_TABLET | Freq: Every day | ORAL | Status: DC
  Administered 2013-09-28: 5 mg via ORAL
  Filled 2013-09-28: qty 1

## 2013-09-28 MED ORDER — HYDROCHLOROTHIAZIDE 25 MG PO TABS
25.0000 mg | ORAL_TABLET | Freq: Every day | ORAL | Status: DC
  Administered 2013-09-28: 25 mg via ORAL
  Filled 2013-09-28: qty 1

## 2013-09-28 MED ORDER — SIMVASTATIN 10 MG PO TABS
10.0000 mg | ORAL_TABLET | Freq: Every day | ORAL | Status: DC
  Filled 2013-09-28: qty 1

## 2013-09-28 MED ORDER — ASPIRIN EC 325 MG PO TBEC
325.0000 mg | DELAYED_RELEASE_TABLET | Freq: Every day | ORAL | Status: DC
  Administered 2013-09-28: 325 mg via ORAL
  Filled 2013-09-28: qty 1

## 2013-09-28 MED ORDER — ASPIRIN 81 MG PO CHEW: 324.0000 mg | CHEWABLE_TABLET | Freq: Every day | ORAL | Status: DC

## 2013-09-28 NOTE — Progress Notes (Signed)
Stroke Team Progress Note  HISTORY Scott Ayala is a 68 y.o. male with a history of htn, hpl who presents with 20 minute period of difficulty with reading earlier. He describes that he was reading and noticed that in both eyes, he was unable to make out words. He states the letters appeared broken. He felt that his peripheral vision remained intact, but not his central vision. He did not have any headache. He does not have a history of migraines. Patient was not administerd TPA secondary to resolved symptoms. He was admitted for further evaluation and treatment.  SUBJECTIVE No family is at the bedside.  Overall he feels his condition is stable.   OBJECTIVE Most recent Vital Signs: Filed Vitals:   09/27/13 2345 09/28/13 0235 09/28/13 0352 09/28/13 0938  BP: 180/78 182/84 139/84 176/93  Pulse: 56 64 59 71  Temp:  98.1 F (36.7 C) 97.9 F (36.6 C) 98.2 F (36.8 C)  TempSrc:  Oral Oral Oral  Resp: 18 20 18 18   Height: 5\' 8"  (1.727 m)     Weight: 99.383 kg (219 lb 1.6 oz)     SpO2: 98% 100% 96% 97%   CBG (last 3)  No results found for this basename: GLUCAP,  in the last 72 hours  IV Fluid Intake:     MEDICATIONS  . aspirin  81 mg Oral Daily  . enoxaparin (LOVENOX) injection  40 mg Subcutaneous Q24H  . fluticasone  1 spray Each Nare Daily  . fluticasone  2 puff Inhalation BID  . hydrochlorothiazide  25 mg Oral Daily  . levothyroxine  50 mcg Oral QAC breakfast  . simvastatin  10 mg Oral q1800   PRN:  albuterol, ondansetron, senna-docusate  Diet:  Cardiac thin liquids Activity:  OOB with assistance DVT Prophylaxis:  Lovenox 40 mg sq daily   CLINICALLY SIGNIFICANT STUDIES Basic Metabolic Panel:   Recent Labs Lab 09/27/13 1230 09/28/13 0035  NA 137  --   K 5.3  --   CL 101  --   CO2 23  --   GLUCOSE 91  --   BUN 11  --   CREATININE 0.81 0.84  CALCIUM 9.1  --    Liver Function Tests: No results found for this basename: AST, ALT, ALKPHOS, BILITOT, PROT, ALBUMIN,  in the  last 168 hours CBC:   Recent Labs Lab 09/27/13 1230 09/28/13 0035  WBC 7.3 5.9  NEUTROABS 3.9  --   HGB 15.2 14.2  HCT 43.4 40.8  MCV 92.3 91.7  PLT 299 320   Coagulation: No results found for this basename: LABPROT, INR,  in the last 168 hours Cardiac Enzymes: No results found for this basename: CKTOTAL, CKMB, CKMBINDEX, TROPONINI,  in the last 168 hours Urinalysis: No results found for this basename: COLORURINE, APPERANCEUR, LABSPEC, PHURINE, GLUCOSEU, HGBUR, BILIRUBINUR, KETONESUR, PROTEINUR, UROBILINOGEN, NITRITE, LEUKOCYTESUR,  in the last 168 hours Lipid Panel    Component Value Date/Time   CHOL 180 09/28/2013 0035   TRIG 97 09/28/2013 0035   HDL 64 09/28/2013 0035   CHOLHDL 2.8 09/28/2013 0035   VLDL 19 09/28/2013 0035   LDLCALC 97 09/28/2013 0035   HgbA1C  No results found for this basename: HGBA1C    Urine Drug Screen:   No results found for this basename: labopia,  cocainscrnur,  labbenz,  amphetmu,  thcu,  labbarb    Alcohol Level: No results found for this basename: ETH,  in the last 168 hours   CT of the  brain  09/27/2013   No intracranial hemorrhage or CT evidence of large acute infarct.   MRI of the brain  09/27/2013  No MR findings of acute ischemia. Normal noncontrast MRI of the brain for age.   MRA of the brain  09/27/2013     Tapering and narrowing of the left carotid terminus, with absent left A1 segment which may reflect occlusion (favored) or, on congenital basis (robust anterior communicating artery present). Narrowed left M1 segment with normal appearance left M2 and distal branches. This suggests focal atheromatous changes without corroborative findings of thrombus/embolus.     2D Echocardiogram    Carotid Doppler  No evidence of hemodynamically significant internal carotid artery stenosis. Vertebral artery flow is antegrade.   CXR  09/28/2013    Borderline enlargement of cardiac silhouette.  No acute abnormalities.     EKG  normal EKG, normal sinus  rhythm. For complete results please see formal report.   Therapy Recommendations   Physical Exam   Pleasant obese Caucasian male not in distress.Awake alert. Afebrile. Head is nontraumatic. Neck is supple without bruit. Hearing is normal. Cardiac exam no murmur or gallop. Lungs are clear to auscultation. Distal pulses are well felt. Neurological Exam ;  Awake  Alert oriented x 3. Normal speech and language.eye movements full without nystagmus.fundi were not visualized. Vision acuity and fields appear normal. Hearing is normal. Palatal movements are normal. Face symmetric. Tongue midline. Normal strength, tone, reflexes and coordination. Normal sensation. Gait deferred. ASSESSMENT [name] is a 68 y.o. male presenting with difficulty reading.  Imaging confirms no acute stroke. Dx:  Probable visual migraine, less likely TIA.  On aspirin 81 mg orally every day prior to admission. Now on aspirin 81 mg orally every day for secondary stroke prevention. Patient with no resultant neuro deficits. Work up underway.  Hyperlipidemia, LDL 97, on no statin PTA, now on no statin, goal LDL < 100 (< 70 for diabetics) Family hx stroke (parents)  Hospital day # 1  TREATMENT/PLAN  Would increase aspirin to aspirin 325 mg orally every day for secondary stroke prevention.  Complete workup  Ok for discharge once workup done.  Annie MainSHARON BIBY, MSN, RN, ANVP-BC, ANP-BC, Lawernce IonGNP-BC [facility] Pager: [phone] 09/28/2013 11:51 AM  I have personally obtained a history, examined the patient, evaluated imaging results, and formulated the assessment and plan of care. I agree with the above. Delia HeadyPramod Lorren Rossetti, MD

## 2013-09-28 NOTE — Consult Note (Signed)
Neurology Consultation Reason for Consult: Difficulty reading Referring Physician: Hijazin, A  CC: Difficulty reading  History is obtained from:patient   HPI: Scott Ayala is a 68 y.o. male with a history of htn, hpl who presents with 20 minute period of difficulty with reading earlier. He describes that he was reading and noticed that in both eyes, he was unable to make out words. He states the letters appeared broken. He felt that his peripheral vision remained intact, but not his central vision.   He did not have any headache. He does not have a history of migraines.    LKW: 11:30 am.  tpa given?: no, resolved symptoms.     ROS: A 14 point ROS was performed and is negative except as noted in the HPI.  Past Medical History  Diagnosis Date  . Hyperlipidemia   . Asthma   . History of actinic keratosis   . Arthritis     Family History: Parents - cva  Social History: Tob: previous smoker  Exam: Current vital signs: BP 158/88  Pulse 76  Temp(Src) 99 F (37.2 C) (Oral)  Resp 19  Wt 99.791 kg (220 lb)  SpO2 98% Vital signs in last 24 hours: Temp:  [97.7 F (36.5 C)-99 F (37.2 C)] 99 F (37.2 C) (02/09 1945) Pulse Rate:  [58-76] 76 (02/09 2045) Resp:  [16-20] 19 (02/09 1913) BP: (149-190)/(87-103) 158/88 mmHg (02/09 2045) SpO2:  [97 %-100 %] 98 % (02/09 2045) Weight:  [99.791 kg (220 lb)] 99.791 kg (220 lb) (02/09 1215)  General: in bed, NAD CV: RRR Mental Status: Patient is awake, alert, oriented to person, place, month, year, and situation. Immediate and remote memory are intact. Patient is able to give a clear and coherent history. No signs of aphasia or neglect Cranial Nerves: II: Visual Fields are full. Pupils are equal, round, and reactive to light.  Discs are difficult to visualize. III,IV, VI: EOMI without ptosis or diploplia.  V: Facial sensation is symmetric to temperature VII: Facial movement is symmetric.  VIII: hearing is intact to voice X:  Uvula elevates symmetrically XI: Shoulder shrug is symmetric. XII: tongue is midline without atrophy or fasciculations.  Motor: Tone is normal. Bulk is normal. 5/5 strength was present in all four extremities.  Sensory: Sensation is symmetric to light touch and temperature in the arms and legs. Deep Tendon Reflexes: 2+ and symmetric in the biceps and patellae.  Plantars: Toes are downgoing bilaterally.  Cerebellar: FNF and HKS are intact bilaterally Gait: Not tested due to multiple medical monitors.    I have reviewed labs in epic and the results pertinent to this consultation are: BMP - unremarkable  I have reviewed the images obtained: CT head - negative.   Impression: 68 yo M with transient bilateral vision change earlier today. I suspect that this represents TIA. Possibly visual association cortex or dominant hemisphere occipital pole. I would favor treating him as TIA at this time.   Recommendations: 1. HgbA1c, fasting lipid panel 2. MRI, MRA  of the brain without contrast 3. Frequent neuro checks 4. Echocardiogram 5. Carotid dopplers 6. Prophylactic therapy-Antiplatelet med: Aspirin - dose 325mg  7. Risk factor modification 8. Telemetry monitoring   Ritta SlotMcNeill Kareli Hossain, MD [facility] [phone]  If 7pm- 7am, please page neurology on call at [phone].

## 2013-09-28 NOTE — Evaluation (Signed)
Speech Language Pathology Evaluation Patient Details Name: Scott Ayala E Hallahan MRN: [id number] DOB: 1946-01-23 Today's Date: 09/28/2013 Time: [id number] SLP Time Calculation (min): 17 min  Problem List:  Patient Active Problem List   Diagnosis Date Noted  . Essential hypertension, benign 09/28/2013  . Visual distortion 09/27/2013  . TIA (transient ischemic attack) 09/27/2013  . Asthma 09/05/2011  . History of actinic keratosis   . Hyperlipidemia   . NONSPECIFIC ABNORMAL FINDING IN STOOL CONTENTS 04/25/2010  . PERSONAL HISTORY OF COLONIC POLYPS 04/25/2010   Past Medical History:  Past Medical History  Diagnosis Date  . Hyperlipidemia   . Asthma   . History of actinic keratosis   . Arthritis    Past Surgical History:  Past Surgical History  Procedure Laterality Date  . Vasectomy     HPI:  68 y.o. male, with past medical history significant for hypertension on no medications, hyperlipidemia and asthma, presenting with an episode of central vision abnormalities while he was reading on the computer, when he noticed that there is some scrambling of the words.  MRI No findings of acute ischemia.   Assessment / Plan / Recommendation Clinical Impression  Pt. demonstrated functional cognitive abilities in all areas.  Speech is intelligible in conversation and pt. able to express thoughts without difficulty.  Pt. reading stroke handout without problems with vision or comprehension.  Does not need ST services at this time.  SLP encouraged pt. to discuss further management/stroke prevention with MD.       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)    Cognition  Overall Cognitive Status: Within Functional Limits for tasks assessed Arousal/Alertness: Awake/alert Orientation Level: Oriented X4 Attention: Sustained Sustained Attention: Appears intact Memory: Appears intact Awareness: Appears intact Problem Solving: Appears intact Safety/Judgment: Appears intact    Comprehension  Auditory Comprehension Overall Auditory Comprehension: Appears within functional limits for tasks assessed Visual Recognition/Discrimination Discrimination: Not tested Reading Comprehension Reading Status: Within funtional limits    Expression Expression Primary Mode of Expression: Verbal Verbal Expression Overall Verbal Expression: Appears within functional limits for tasks assessed Initiation: No impairment Level of Generative/Spontaneous Verbalization: Conversation Repetition: No impairment Naming: No impairment Pragmatics: No impairment Written Expression Written Expression: Not tested   Oral / Motor Oral Motor/Sensory Function Overall Oral Motor/Sensory Function: Impaired Labial ROM: Within Functional Limits Labial Symmetry: Abnormal symmetry left (trace left droop at rest) Labial Strength: Within Functional Limits Labial Sensation: Within Functional Limits Lingual ROM: Within Functional Limits Lingual Symmetry: Within Functional Limits Lingual Strength: Within Functional Limits Lingual Sensation: Within Functional Limits Facial ROM: Within Functional Limits Facial Symmetry: Within Functional Limits Facial Strength: Within Functional Limits Facial Sensation: Within Functional Limits Velum: Within Functional Limits Mandible: Within Functional Limits Motor Speech Overall Motor Speech: Appears within functional limits for tasks assessed Respiration: Within functional limits Phonation: Normal Resonance: Within functional limits Articulation: Within functional limitis Intelligibility: Intelligible Motor Planning: Witnin functional limits         [employer]attLisa Willis Shaune Westfall M.Ed [employer]P Pager [phone]  09/28/2013

## 2013-09-28 NOTE — Progress Notes (Signed)
PROGRESS NOTE   Scott Ayala:[id number] DOB: 1945/09/04 DOA: 09/27/2013 PCP: [name], MD  Brief narrative: Scott Ayala is an 68 y.o. male with a PMH of hypertension, hyperlipidemia, and asthma who takes no medications routinely, and tube was admitted on 09/27/13 with acute central vision loss and word finding difficulty that lasted approximately 20 minutes. Upon initial evaluation in the ED, a CT scan of the head was negative. Neurology consultation was requested.  Assessment/Plan: Principal Problem:   TIA (transient ischemic attack) with visual distortion Patient was admitted and seen by neurology. A full neurological evaluation was ordered. Hemoglobin A1c is pending. MRI of the head was negative for findings of acute ischemia and was normal. MRI A. showed a focal atheromatous change involving the left carotid terminus with an absent left A1 segment which may reflect occlusion or a congenital finding. Carotid Dopplers and 2-D echocardiography are pending. Lipids show a cholesterol of 180, LDL 97. Continue aspirin, risk factor modification, and telemetry monitoring for now. Active Problems:   Hypothyroidism Continue synthroid.   Hyperlipidemia Currently takes red yeast rice supplements. Would benefit from statin therapy.  Start Simvastatin.    Essential hypertension, benign Not on routine medication. Systolic blood pressures in the 180s at times. Start Norvasc.   Asthma Continue albuterol as needed, and Pulmicort.   DVT Prophylaxis Continue Lovenox.  Code Status: Full. Family Communication: No family at the bedside. Disposition Plan: Home when stable.   IV access:  Peripheral IV  Medical Consultants:  Dr. Ritta Slot, Neurology.  Other Consultants:  None  Anti-infectives:  None  HPI/Subjective: Scott Ayala denies any speech difficulty, visual problems, motor deficits.    Objective: Filed Vitals:   09/27/13 2045 09/27/13 2345 09/28/13 0235  09/28/13 0352  BP: 158/88 180/78 182/84 139/84  Pulse: 76 56 64 59  Temp:   98.1 F (36.7 C) 97.9 F (36.6 C)  TempSrc:   Oral Oral  Resp:  18 20 18   Height:  5\' 8"  (1.727 m)    Weight:  99.383 kg (219 lb 1.6 oz)    SpO2: 98% 98% 100% 96%    Intake/Output Summary (Last 24 hours) at 09/28/13 0842 Last data filed at 09/28/13 8119  Gross per 24 hour  Intake    480 ml  Output      0 ml  Net    480 ml    Exam: Gen:  NAD Cardiovascular:  RRR, No M/R/G Respiratory:  Lungs CTAB Gastrointestinal:  Abdomen soft, NT/ND, + BS Extremities:  No C/E/C  Data Reviewed: Basic Metabolic Panel:  Recent Labs Lab 09/27/13 1230 09/28/13 0035  NA 137  --   K 5.3  --   CL 101  --   CO2 23  --   GLUCOSE 91  --   BUN 11  --   CREATININE 0.81 0.84  CALCIUM 9.1  --    GFR Estimated Creatinine Clearance: 97.5 ml/min (by C-G formula based on Cr of 0.84).  CBC:  Recent Labs Lab 09/27/13 1230 09/28/13 0035  WBC 7.3 5.9  NEUTROABS 3.9  --   HGB 15.2 14.2  HCT 43.4 40.8  MCV 92.3 91.7  PLT 299 320   Lipid Profile  Recent Labs  09/28/13 0035  CHOL 180  HDL 64  LDLCALC 97  TRIG 97  CHOLHDL 2.8   Microbiology No results found for this or any previous visit (from the past 240 hour(s)).   Procedures and Diagnostic Studies: Dg Chest 2  View  09/28/2013   CLINICAL DATA:  Stroke, history asthma, former smoker  EXAM: CHEST  2 VIEW  COMPARISON:  None.  FINDINGS: Borderline enlargement of cardiac silhouette. Mediastinal contours and pulmonary vascularity normal.  Lungs slightly hyperinflated but clear.  No pleural effusion or pneumothorax.  Bones unremarkable.  IMPRESSION: Borderline enlargement of cardiac silhouette.  No acute abnormalities.   Electronically Signed   By: Ulyses SouthwardMark  Boles M.D.   On: 09/28/2013 07:45   Ct Head Wo Contrast  09/27/2013   CLINICAL DATA:  Blurred vision.  Recent flu.  Hyperlipidemia.  EXAM: CT HEAD WITHOUT CONTRAST  TECHNIQUE: Contiguous axial images were  obtained from the base of the skull through the vertex without intravenous contrast.  COMPARISON:  None.  FINDINGS: No intracranial hemorrhage.  No CT evidence of large acute infarct. If infarct is of high clinical concern (particularly posterior fossa infarct) MR may be considered.  No intracranial mass lesion detected on this unenhanced exam.  Mild atrophy without hydrocephalus.  Orbital structures unremarkable.  Minimal mucosal thickening ethmoid sinus air cells.  IMPRESSION: No intracranial hemorrhage or CT evidence of large acute infarct. Please see above.   Electronically Signed   By: Bridgett LarssonSteve  Olson M.D.   On: 09/27/2013 13:25   Mr Maxine GlennMra Head Wo Contrast  09/27/2013   CLINICAL DATA:  Hypertension, hyperlipidemia, central vision abnormality. Word-finding difficulties.  EXAM: MRI HEAD WITHOUT CONTRAST  MRA HEAD WITHOUT CONTRAST  TECHNIQUE: Multiplanar, multiecho pulse sequences of the brain and surrounding structures were obtained without intravenous contrast. Angiographic images of the head were obtained using MRA technique without contrast.  COMPARISON:  CT of the head September 27, 2013 at 1305 hr.  FINDINGS: MRI HEAD FINDINGS  No reduced diffusion to suggest acute ischemia. No susceptibility artifact to suggest hemorrhage.  The ventricles and sulci are normal for patient's age. Mild patchy supratentorial white matter T2 hyperintensities are less than expected for age and though nonspecific suggest sequelae of chronic small vessel ischemic disease. No midline shift, mass effect or mass lesions.  No abnormal extra-axial fluid collections. Trace paranasal sinus mucosal thickening without air-fluid levels. Mastoid air cells are well aerated. Ocular globes and orbital contents are nonsuspicious though not tailored for evaluation. No abnormal sellar expansion. No cerebellar tonsillar ectopia. No suspicious calvarial bone marrow signal.  MRA HEAD FINDINGS  Anterior circulation: Normal flow related enhancement of the  included cervical internal carotid artery, the petrous, cavernous internal carotid arteries. There is markings tapering of the left supra clinoid and internal carotid artery to level the carotid terminus. Near complete loss of the left A1 flow void with small apparent infundibulum at the A-comm junction, with mild narrowing of the left M1 segment, relatively symmetric appearance of the M2 and M3 branches. Patent anterior communicating artery with robust flow related enhancement of the anterior cerebral arteries. Ophthalmic arteries are patent with normal flow related enhancement.  Posterior circulation: Left vertebral artery is dominant, mild narrowing of the distal right vertebral artery proximal to the vertebral basilar junction without focal stenoses. Normal flow related enhancement the basilar artery and main branch vessels. Normal flow voids enhancement posterior cerebral arteries.  No aneurysm or suspicious luminal regularity within the anterior nor posterior circulation.  IMPRESSION: MRI head: No MR findings of acute ischemia. Normal noncontrast MRI of the brain for age.  MRA head: Tapering and narrowing of the left carotid terminus, with absent left A1 segment which may reflect occlusion (favored) or, on congenital basis (robust anterior communicating artery present). Narrowed  left M1 segment with normal appearance left M2 and distal branches. This suggests focal atheromatous changes without corroborative findings of thrombus/embolus.   Electronically Signed   By: Awilda Metro   On: 09/27/2013 23:37   Mr Brain Wo Contrast  09/27/2013   CLINICAL DATA:  Hypertension, hyperlipidemia, central vision abnormality. Word-finding difficulties.  EXAM: MRI HEAD WITHOUT CONTRAST  MRA HEAD WITHOUT CONTRAST  TECHNIQUE: Multiplanar, multiecho pulse sequences of the brain and surrounding structures were obtained without intravenous contrast. Angiographic images of the head were obtained using MRA technique without  contrast.  COMPARISON:  CT of the head September 27, 2013 at 1305 hr.  FINDINGS: MRI HEAD FINDINGS  No reduced diffusion to suggest acute ischemia. No susceptibility artifact to suggest hemorrhage.  The ventricles and sulci are normal for patient's age. Mild patchy supratentorial white matter T2 hyperintensities are less than expected for age and though nonspecific suggest sequelae of chronic small vessel ischemic disease. No midline shift, mass effect or mass lesions.  No abnormal extra-axial fluid collections. Trace paranasal sinus mucosal thickening without air-fluid levels. Mastoid air cells are well aerated. Ocular globes and orbital contents are nonsuspicious though not tailored for evaluation. No abnormal sellar expansion. No cerebellar tonsillar ectopia. No suspicious calvarial bone marrow signal.  MRA HEAD FINDINGS  Anterior circulation: Normal flow related enhancement of the included cervical internal carotid artery, the petrous, cavernous internal carotid arteries. There is markings tapering of the left supra clinoid and internal carotid artery to level the carotid terminus. Near complete loss of the left A1 flow void with small apparent infundibulum at the A-comm junction, with mild narrowing of the left M1 segment, relatively symmetric appearance of the M2 and M3 branches. Patent anterior communicating artery with robust flow related enhancement of the anterior cerebral arteries. Ophthalmic arteries are patent with normal flow related enhancement.  Posterior circulation: Left vertebral artery is dominant, mild narrowing of the distal right vertebral artery proximal to the vertebral basilar junction without focal stenoses. Normal flow related enhancement the basilar artery and main branch vessels. Normal flow voids enhancement posterior cerebral arteries.  No aneurysm or suspicious luminal regularity within the anterior nor posterior circulation.  IMPRESSION: MRI head: No MR findings of acute ischemia.  Normal noncontrast MRI of the brain for age.  MRA head: Tapering and narrowing of the left carotid terminus, with absent left A1 segment which may reflect occlusion (favored) or, on congenital basis (robust anterior communicating artery present). Narrowed left M1 segment with normal appearance left M2 and distal branches. This suggests focal atheromatous changes without corroborative findings of thrombus/embolus.   Electronically Signed   By: Awilda Metro   On: 09/27/2013 23:37    Scheduled Meds: . aspirin  81 mg Oral Daily  . enoxaparin (LOVENOX) injection  40 mg Subcutaneous Q24H  . fluticasone  1 spray Each Nare Daily  . fluticasone  2 puff Inhalation BID  . levothyroxine  50 mcg Oral QAC breakfast   Continuous Infusions:   Time spent: 30 minutes.   LOS: 1 day   Maliyah Willets  [facility] Pager [phone]. If unable to reach me by pager, please call my cell phone at [phone].  *Please note that the hospitalists switch teams on Wednesdays. Please call the flow manager at [phone] if you are having difficulty reaching the hospitalist taking care of this patient as she can update you and provide the most up-to-date pager number of provider caring for the patient. If 8PM-8AM, please contact night-coverage at [email or web address], password [facility]  09/28/2013, 8:42 AM    **Disclaimer: This note was dictated with voice recognition software. Similar sounding words can inadvertently be transcribed and this note may contain transcription errors which may not have been corrected upon publication of note.**

## 2013-09-28 NOTE — Discharge Summary (Signed)
Physician Discharge Summary  Scott Ayala:[id number] DOB: [birth year] DOA: 09/27/2013  PCP: [name], MD  Admit date: 09/27/2013 Discharge date: 09/28/2013  Recommendations for Outpatient Follow-up:  1. F/U PCP in 1 week for blood pressure re-check, adjustment of anti-hypertensive therapy if indicated. 2. F/U hemoglobin A1c (pending at discharge). 3. F/U LFTs 4-6 weeks after starting statin.  Discharge Diagnoses:  Principal Problem:    TIA (transient ischemic attack) versus ocular migraine Active Problems:    Hyperlipidemia    Visual distortion    Essential hypertension, benign    Bilateral carotid artery stenosis 0-39%  Discharge Condition: Improved.  Diet recommendation: Low sodium, heart healthy.  History of present illness:  Scott Ayala is an 68 y.o. male with a PMH of hypertension, hyperlipidemia, and asthma who takes no medications routinely, and who was admitted on 09/27/13 with acute central vision loss and word finding difficulty that lasted approximately 20 minutes. Upon initial evaluation in the ED, a CT scan of the head was negative. Neurology consultation was requested.  Hospital Course by problem:  Principal Problem:  TIA (transient ischemic attack) with visual distortion versus ocular migraine  Patient was admitted and seen by neurology. A full neurological evaluation was ordered. Hemoglobin A1c is pending. MRI of the head was negative for findings of acute ischemia. MRA. showed a focal atheromatous change involving the left carotid terminus with an absent left A1 segment which may reflect occlusion or a congenital finding. Carotid Dopplers showed 0-39% occlusion bilaterally and 2-D echocardiography was WNL except for some LV hypertrophy. Lipids show a cholesterol of 180, LDL 97. Continue aspirin, risk factor modification with BP and lipid control.  Active Problems:  Hypothyroidism  Continue synthroid.  Hyperlipidemia  Currently takes red yeast rice  supplements. Would benefit from statin therapy. Started Simvastatin. F/U LFTs in 4-6 weeks. Essential hypertension, benign  Not on routine medication. Systolic blood pressures in the 180s at times. Started HCTZ.  Concerned about problems with impotence and the effect of BP medication on this.  Asthma  Continue albuterol as needed, and Pulmicort.  DVT Prophylaxis  Continue Lovenox.  Procedures:  2 D Echo 09/1013: Study Conclusions  Left ventricle: The cavity size was normal. There was mild concentric hypertrophy. Systolic function was normal. The estimated ejection fraction was in the range of 55% to 60%. Wall motion was normal; there were no regional wall motion abnormalities.    Carotid Dopplers: Preliminary report: Bilateral: 1-39% ICA stenosis. Vertebral artery flow is antegrade.    Consultations:  Dr. Ritta Slot, Neurology.  Discharge Exam: Filed Vitals:   09/28/13 0938  BP: 176/93  Pulse: 71  Temp: 98.2 F (36.8 C)  Resp: 18   Filed Vitals:   09/27/13 2345 09/28/13 0235 09/28/13 0352 09/28/13 0938  BP: 180/78 182/84 139/84 176/93  Pulse: 56 64 59 71  Temp:  98.1 F (36.7 C) 97.9 F (36.6 C) 98.2 F (36.8 C)  TempSrc:  Oral Oral Oral  Resp: 18 20 18 18   Height: 5\' 8"  (1.727 m)     Weight: 99.383 kg (219 lb 1.6 oz)     SpO2: 98% 100% 96% 97%    Gen:  NAD Cardiovascular:  RRR, No M/R/G Respiratory: Lungs CTAB Gastrointestinal: Abdomen soft, NT/ND with normal active bowel sounds. Extremities: No C/E/C Neuro: Non-focal   Discharge Instructions      Discharge Orders   Future Orders Complete By Expires   Activity as tolerated - No restrictions  As directed    Scheduling  Instructions:     2 D Echo results: Study Conclusions  Left ventricle: The cavity size was normal. There was mild concentric hypertrophy. Systolic function was normal. The estimated ejection fraction was in the range of 55% to 60%. Wall motion was normal; there were no  regional wall motion abnormalities.   MRI/MRA results:   Call MD for:  As directed    Scheduling Instructions:     Any recurrent visual problems, headaches, loss of power or numb sensations.   Diet - low sodium heart healthy  As directed    Discharge instructions  As directed    Scheduling Instructions:     IMPRESSION: MRI head: No MR findings of acute ischemia. Normal noncontrast MRI of the brain for age.  Carotid Doppler Preliminary report:  Bilateral:  1-39% ICA stenosis.  Vertebral artery flow is antegrade.   MRA head: Tapering and narrowing of the left carotid terminus, with absent left A1 segment which may reflect occlusion (favored) or, on congenital basis (robust anterior communicating artery present). Narrowed left M1 segment with normal appearance left M2 and distal branches. This suggests focal atheromatous changes without corroborative findings of thrombus/embolus.   Discharge instructions  As directed    Comments:     Carotid Doppler Preliminary report:  Bilateral:  1-39% ICA stenosis.  Vertebral artery flow is antegrade.       Medication List    STOP taking these medications       Red Yeast Rice 600 MG Caps      TAKE these medications       albuterol 108 (90 BASE) MCG/ACT inhaler  Commonly known as:  VENTOLIN HFA  Inhale 2 puffs into the lungs every 6 (six) hours as needed for wheezing.     aspirin 81 MG chewable tablet  Chew 4 tablets (324 mg total) by mouth daily.     budesonide 180 MCG/ACT inhaler  Commonly known as:  PULMICORT  Inhale 2 puffs into the lungs 2 (two) times daily.     hydrochlorothiazide 25 MG tablet  Commonly known as:  HYDRODIURIL  Take 1 tablet (25 mg total) by mouth daily.     levothyroxine 50 MCG tablet  Commonly known as:  SYNTHROID, LEVOTHROID  Take 1 tablet (50 mcg total) by mouth daily before breakfast.     mometasone 50 MCG/ACT nasal spray  Commonly known as:  NASONEX  Place 2 sprays into the nose daily.      ondansetron 8 MG tablet  Commonly known as:  ZOFRAN  Take 8 mg by mouth every 8 (eight) hours as needed for nausea or vomiting.     simvastatin 10 MG tablet  Commonly known as:  ZOCOR  Take 1 tablet (10 mg total) by mouth daily at 6 PM.       Follow-up Information   Follow up with [name], MD. Schedule an appointment as soon as possible for a visit in 1 week. [facility] follow up, BP check.)    Specialty:  Family Medicine   Contact information:   [address] [place] [address] [phone]        The results of significant diagnostics from this hospitalization (including imaging, microbiology, ancillary and laboratory) are listed below for reference.    Significant Diagnostic Studies: Dg Chest 2 View  09/28/2013   CLINICAL DATA:  Stroke, history asthma, former smoker  EXAM: CHEST  2 VIEW  COMPARISON:  None.  FINDINGS: Borderline enlargement of cardiac silhouette. Mediastinal contours and pulmonary vascularity normal.  Lungs slightly hyperinflated but clear.  No pleural effusion or pneumothorax.  Bones unremarkable.  IMPRESSION: Borderline enlargement of cardiac silhouette.  No acute abnormalities.   Electronically Signed   By: Ulyses SouthwardMark  Boles M.D.   On: 09/28/2013 07:45   Ct Head Wo Contrast  09/27/2013   CLINICAL DATA:  Blurred vision.  Recent flu.  Hyperlipidemia.  EXAM: CT HEAD WITHOUT CONTRAST  TECHNIQUE: Contiguous axial images were obtained from the base of the skull through the vertex without intravenous contrast.  COMPARISON:  None.  FINDINGS: No intracranial hemorrhage.  No CT evidence of large acute infarct. If infarct is of high clinical concern (particularly posterior fossa infarct) MR may be considered.  No intracranial mass lesion detected on this unenhanced exam.  Mild atrophy without hydrocephalus.  Orbital structures unremarkable.  Minimal mucosal thickening ethmoid sinus air cells.  IMPRESSION: No intracranial hemorrhage or CT evidence of large acute infarct.  Please see above.   Electronically Signed   By: Bridgett LarssonSteve  Olson M.D.   On: 09/27/2013 13:25   Mr Maxine GlennMra Head Wo Contrast  09/27/2013   CLINICAL DATA:  Hypertension, hyperlipidemia, central vision abnormality. Word-finding difficulties.  EXAM: MRI HEAD WITHOUT CONTRAST  MRA HEAD WITHOUT CONTRAST  TECHNIQUE: Multiplanar, multiecho pulse sequences of the brain and surrounding structures were obtained without intravenous contrast. Angiographic images of the head were obtained using MRA technique without contrast.  COMPARISON:  CT of the head September 27, 2013 at 1305 hr.  FINDINGS: MRI HEAD FINDINGS  No reduced diffusion to suggest acute ischemia. No susceptibility artifact to suggest hemorrhage.  The ventricles and sulci are normal for patient's age. Mild patchy supratentorial white matter T2 hyperintensities are less than expected for age and though nonspecific suggest sequelae of chronic small vessel ischemic disease. No midline shift, mass effect or mass lesions.  No abnormal extra-axial fluid collections. Trace paranasal sinus mucosal thickening without air-fluid levels. Mastoid air cells are well aerated. Ocular globes and orbital contents are nonsuspicious though not tailored for evaluation. No abnormal sellar expansion. No cerebellar tonsillar ectopia. No suspicious calvarial bone marrow signal.  MRA HEAD FINDINGS  Anterior circulation: Normal flow related enhancement of the included cervical internal carotid artery, the petrous, cavernous internal carotid arteries. There is markings tapering of the left supra clinoid and internal carotid artery to level the carotid terminus. Near complete loss of the left A1 flow void with small apparent infundibulum at the A-comm junction, with mild narrowing of the left M1 segment, relatively symmetric appearance of the M2 and M3 branches. Patent anterior communicating artery with robust flow related enhancement of the anterior cerebral arteries. Ophthalmic arteries are patent  with normal flow related enhancement.  Posterior circulation: Left vertebral artery is dominant, mild narrowing of the distal right vertebral artery proximal to the vertebral basilar junction without focal stenoses. Normal flow related enhancement the basilar artery and main branch vessels. Normal flow voids enhancement posterior cerebral arteries.  No aneurysm or suspicious luminal regularity within the anterior nor posterior circulation.  IMPRESSION: MRI head: No MR findings of acute ischemia. Normal noncontrast MRI of the brain for age.  MRA head: Tapering and narrowing of the left carotid terminus, with absent left A1 segment which may reflect occlusion (favored) or, on congenital basis (robust anterior communicating artery present). Narrowed left M1 segment with normal appearance left M2 and distal branches. This suggests focal atheromatous changes without corroborative findings of thrombus/embolus.   Electronically Signed   By: Awilda Metroourtnay  Bloomer   On: 09/27/2013 23:37  Mr Brain Wo Contrast  09/27/2013   CLINICAL DATA:  Hypertension, hyperlipidemia, central vision abnormality. Word-finding difficulties.  EXAM: MRI HEAD WITHOUT CONTRAST  MRA HEAD WITHOUT CONTRAST  TECHNIQUE: Multiplanar, multiecho pulse sequences of the brain and surrounding structures were obtained without intravenous contrast. Angiographic images of the head were obtained using MRA technique without contrast.  COMPARISON:  CT of the head September 27, 2013 at 1305 hr.  FINDINGS: MRI HEAD FINDINGS  No reduced diffusion to suggest acute ischemia. No susceptibility artifact to suggest hemorrhage.  The ventricles and sulci are normal for patient's age. Mild patchy supratentorial white matter T2 hyperintensities are less than expected for age and though nonspecific suggest sequelae of chronic small vessel ischemic disease. No midline shift, mass effect or mass lesions.  No abnormal extra-axial fluid collections. Trace paranasal sinus mucosal  thickening without air-fluid levels. Mastoid air cells are well aerated. Ocular globes and orbital contents are nonsuspicious though not tailored for evaluation. No abnormal sellar expansion. No cerebellar tonsillar ectopia. No suspicious calvarial bone marrow signal.  MRA HEAD FINDINGS  Anterior circulation: Normal flow related enhancement of the included cervical internal carotid artery, the petrous, cavernous internal carotid arteries. There is markings tapering of the left supra clinoid and internal carotid artery to level the carotid terminus. Near complete loss of the left A1 flow void with small apparent infundibulum at the A-comm junction, with mild narrowing of the left M1 segment, relatively symmetric appearance of the M2 and M3 branches. Patent anterior communicating artery with robust flow related enhancement of the anterior cerebral arteries. Ophthalmic arteries are patent with normal flow related enhancement.  Posterior circulation: Left vertebral artery is dominant, mild narrowing of the distal right vertebral artery proximal to the vertebral basilar junction without focal stenoses. Normal flow related enhancement the basilar artery and main branch vessels. Normal flow voids enhancement posterior cerebral arteries.  No aneurysm or suspicious luminal regularity within the anterior nor posterior circulation.  IMPRESSION: MRI head: No MR findings of acute ischemia. Normal noncontrast MRI of the brain for age.  MRA head: Tapering and narrowing of the left carotid terminus, with absent left A1 segment which may reflect occlusion (favored) or, on congenital basis (robust anterior communicating artery present). Narrowed left M1 segment with normal appearance left M2 and distal branches. This suggests focal atheromatous changes without corroborative findings of thrombus/embolus.   Electronically Signed   By: Awilda Metro   On: 09/27/2013 23:37    Labs:  Basic Metabolic Panel:  Recent Labs Lab  09/27/13 1230 09/28/13 0035  NA 137  --   K 5.3  --   CL 101  --   CO2 23  --   GLUCOSE 91  --   BUN 11  --   CREATININE 0.81 0.84  CALCIUM 9.1  --    GFR Estimated Creatinine Clearance: 97.5 ml/min (by C-G formula based on Cr of 0.84).  CBC:  Recent Labs Lab 09/27/13 1230 09/28/13 0035  WBC 7.3 5.9  NEUTROABS 3.9  --   HGB 15.2 14.2  HCT 43.4 40.8  MCV 92.3 91.7  PLT 299 320   Lipid Profile  Recent Labs  09/28/13 0035  CHOL 180  HDL 64  LDLCALC 97  TRIG 97  CHOLHDL 2.8    Time coordinating discharge: 35 minutes.  Signed:  RAMA,CHRISTINA  Pager [phone] [facility] 09/28/2013, 2:40 PM

## 2013-09-28 NOTE — Evaluation (Signed)
Occupational Therapy Evaluation Patient Details Name: Scott Ayala MRN: [id number] DOB: 1946/05/14 Today's Date: 09/28/2013 Time: [id number] OT Time Calculation (min): 11 min  OT Assessment / Plan / Recommendation History of present illness 68 yo male s/p 20 mins of central vision disturbance. MRI / CT negative. pt being worked up for TIA   Clinical Impression   Patient evaluated by Occupational Therapy with no further acute OT needs identified. All education has been completed and the patient has no further questions. See below for any follow-up Occupational Therapy or equipment needs. OT to sign off. Thank you for referral.      OT Assessment  Patient does not need any further OT services    Follow Up Recommendations  No OT follow up    Barriers to Discharge      Equipment Recommendations  None recommended by OT    Recommendations for Other Services    Frequency       Precautions / Restrictions Precautions Precautions: None   Pertinent Vitals/Pain none    ADL  Eating/Feeding: Independent Grooming: Wash/dry hands;Wash/dry face;Teeth care;Independent Where Assessed - Grooming: Unsupported standing Upper Body Dressing: Independent Where Assessed - Upper Body Dressing: Unsupported standing Lower Body Dressing: Independent Where Assessed - Lower Body Dressing: Unsupported sit to stand Toilet Transfer: Independent Toilet Transfer Method: Sit to stand Toilet Transfer Equipment: Regular height toilet Tub/Shower Transfer: Independent Tub/Shower Transfer Method: Science writerAmbulating Tub/Shower Transfer Equipment: Walk in shower Equipment Used: Gait belt Transfers/Ambulation Related to ADLs: baseline no deficits noted ADL Comments: pt is at baseline. pt able to read various print sizes and all 4 quadrants.     OT Diagnosis:    OT Problem List:   OT Treatment Interventions:     OT Goals(Current goals can be found in the care plan section) Acute Rehab OT Goals Patient Stated  Goal: to go home  Visit Information  Last OT Received On: 09/28/13 Assistance Needed: +1 History of Present Illness: 68 yo male s/p 20 mins of central vision disturbance. MRI / CT negative. pt being worked up for TIA       Prior Functioning     Home Living Family/patient expects to be discharged to:: Private residence Living Arrangements: Spouse/significant other (spouse staying at other location caregiver for mother) Type of Home: House Home Access: Stairs to enter Secretary/administratorntrance Stairs-Number of Steps: 4 Entrance Stairs-Rails: None Home Layout: Two level;Bed/bath upstairs Alternate Level Stairs-Number of Steps: 12 Alternate Level Stairs-Rails: Right;Left Home Equipment: Walker - 2 wheels Additional Comments: walk in shower, standard  Lives With: Spouse Prior Function Level of Independence: Independent Communication Communication: No difficulties Dominant Hand: Right         Vision/Perception Vision - History Baseline Vision: Wears glasses all the time Patient Visual Report: No change from baseline Vision - Assessment Eye Alignment: Within Functional Limits Vision Assessment: Vision tested Ocular Range of Motion: Within Functional Limits Tracking/Visual Pursuits: Able to track stimulus in all quads without difficulty Perception Perception: Within Functional Limits Praxis Praxis: Intact   Cognition  Cognition Arousal/Alertness: Awake/alert Behavior During Therapy: WFL for tasks assessed/performed Overall Cognitive Status: Within Functional Limits for tasks assessed    Extremity/Trunk Assessment Upper Extremity Assessment Upper Extremity Assessment: Overall WFL for tasks assessed Lower Extremity Assessment Lower Extremity Assessment: Overall WFL for tasks assessed Cervical / Trunk Assessment Cervical / Trunk Assessment: Normal     Mobility Bed Mobility Overal bed mobility: Independent Transfers Overall transfer level: Independent     Exercise  Balance Standardized Balance Assessment Standardized Balance Assessment : Dynamic Gait Index Dynamic Gait Index Level Surface: Normal Change in Gait Speed: Normal Gait with Horizontal Head Turns: Normal Gait with Vertical Head Turns: Normal Gait and Pivot Turn: Normal Step Over Obstacle: Normal Step Around Obstacles: Normal Steps: Normal Total Score: 24 General Comments General comments (skin integrity, edema, etc.): wfl   End of Session OT - End of Session Activity Tolerance: Patient tolerated treatment well Patient left: Other (comment) (with PT [facility]) Nurse Communication: Mobility status  GO     [name] 09/28/2013, 11:36 AM Pager: [phone]

## 2013-09-28 NOTE — Progress Notes (Signed)
  Echocardiogram 2D Echocardiogram has been performed.  Farrel DemarkJill Eunice, RDMS, RVT  09/28/2013, 12:10 PM

## 2013-09-28 NOTE — Progress Notes (Signed)
UR complete.  Vaneza Pickart RN, MSN 

## 2013-09-28 NOTE — Evaluation (Signed)
Physical Therapy Evaluation Patient Details Name: Scott Ayala MRN: [id number] DOB: Oct 01, 1945 Today's Date: 09/28/2013 Time: [phone] (out of room from 1057 to 1114) PT Time Calculation (min):  26 min  PT Assessment / Plan / Recommendation History of Present Illness  68 yo male s/p 20 mins of central vision disturbance. MRI / CT negative. pt being worked up for TIA  Clinical Impression  Patient independent no acute PT needs will sign off.    PT Assessment  Patent does not need any further PT services    Follow Up Recommendations   No PT follow up                   Precautions / Restrictions Precautions Precautions: None   Pertinent Vitals/Pain No pain at this time      Mobility  Bed Mobility Overal bed mobility: Independent Transfers Overall transfer level: Independent Ambulation/Gait Ambulation/Gait assistance: Independent Assistive device: None Gait Pattern/deviations: WFL(Within Functional Limits)       PT Goals(Current goals can be found in the care plan section) Acute Rehab PT Goals Patient Stated Goal: to go home PT Goal Formulation: No goals set, d/c therapy  Visit Information  Last PT Received On: 09/28/13 Assistance Needed: +1 History of Present Illness: 68 yo male s/p 20 mins of central vision disturbance. MRI / CT negative. pt being worked up for TIA       Prior Functioning  Home Living Family/patient expects to be discharged to:: Private residence Living Arrangements: Spouse/significant other (spouse staying at other location caregiver for mother) Type of Home: House Home Access: Stairs to enter Secretary/administratorntrance Stairs-Number of Steps: 4 Entrance Stairs-Rails: None Home Layout: Two level;Bed/bath upstairs Alternate Level Stairs-Number of Steps: 12 Alternate Level Stairs-Rails: Right;Left Home Equipment: Walker - 2 wheels Additional Comments: walk in shower, standard  Lives With: Spouse Prior Function Level of Independence:  Independent Communication Communication: No difficulties Dominant Hand: Right    Cognition  Cognition Arousal/Alertness: Awake/alert Behavior During Therapy: WFL for tasks assessed/performed Overall Cognitive Status: Within Functional Limits for tasks assessed    Extremity/Trunk Assessment Upper Extremity Assessment Upper Extremity Assessment: Overall WFL for tasks assessed Lower Extremity Assessment Lower Extremity Assessment: Overall WFL for tasks assessed Cervical / Trunk Assessment Cervical / Trunk Assessment: Normal   Balance Standardized Balance Assessment Standardized Balance Assessment : Dynamic Gait Index Dynamic Gait Index Level Surface: Normal Change in Gait Speed: Normal Gait with Horizontal Head Turns: Normal Gait with Vertical Head Turns: Normal Gait and Pivot Turn: Normal Step Over Obstacle: Normal Step Around Obstacles: Normal Steps: Normal Total Score: 24 General Comments General comments (skin integrity, edema, etc.): wfl  End of Session PT - End of Session Equipment Utilized During Treatment: Gait belt Activity Tolerance: Patient tolerated treatment well Patient left: in chair;with call bell/phone within reach Nurse Communication: Mobility status  GP     Fabio AsaWerner, Scott Ayala 09/28/2013, 11:43 AM Charlotte Crumbevon Juliya Magill, PT DPT  [phone]

## 2013-09-28 NOTE — Progress Notes (Signed)
Pt discharge education and instructions with pt and daughter at bedside. Both denies any questions, all lines including telemetry and IV removed. Pt provided education handout on HTN, TIA, "Beyond the Hospital" and "Stroke and Cholesterol" pamphlets. Pt ambulated off the unit with his belongings and daughter at side.

## 2013-09-28 NOTE — Progress Notes (Signed)
VASCULAR LAB PRELIMINARY  PRELIMINARY  PRELIMINARY  PRELIMINARY  Carotid duplex  completed.    Preliminary report:  Bilateral:  1-39% ICA stenosis.  Vertebral artery flow is antegrade.      Alberto Pina, RVT 09/28/2013, 10:39 AM

## 2013-09-28 NOTE — Discharge Instructions (Signed)

## 2013-10-01 ENCOUNTER — Emergency Department (HOSPITAL_COMMUNITY)
Admission: EM | Admit: 2013-10-01 | Discharge: 2013-10-01 | Disposition: A | Discharge status: Home / Self Care | Payer: BC Managed Care – PPO | Attending: Emergency Medicine | Admitting: Emergency Medicine

## 2013-10-01 ENCOUNTER — Other Ambulatory Visit: Payer: Self-pay | Admitting: Cardiology

## 2013-10-01 ENCOUNTER — Emergency Department (HOSPITAL_COMMUNITY): Payer: BC Managed Care – PPO

## 2013-10-01 ENCOUNTER — Encounter (HOSPITAL_COMMUNITY): Payer: Self-pay | Admitting: Emergency Medicine

## 2013-10-01 DIAGNOSIS — R001 Bradycardia, unspecified: Secondary | ICD-10-CM

## 2013-10-01 DIAGNOSIS — IMO0002 Reserved for concepts with insufficient information to code with codable children: Secondary | ICD-10-CM | POA: Insufficient documentation

## 2013-10-01 DIAGNOSIS — I6529 Occlusion and stenosis of unspecified carotid artery: Secondary | ICD-10-CM

## 2013-10-01 DIAGNOSIS — S025XXA Fracture of tooth (traumatic), initial encounter for closed fracture: Secondary | ICD-10-CM | POA: Insufficient documentation

## 2013-10-01 DIAGNOSIS — I959 Hypotension, unspecified: Secondary | ICD-10-CM

## 2013-10-01 DIAGNOSIS — I10 Essential (primary) hypertension: Secondary | ICD-10-CM | POA: Insufficient documentation

## 2013-10-01 DIAGNOSIS — Z79899 Other long term (current) drug therapy: Secondary | ICD-10-CM | POA: Insufficient documentation

## 2013-10-01 DIAGNOSIS — S0180XA Unspecified open wound of other part of head, initial encounter: Secondary | ICD-10-CM | POA: Insufficient documentation

## 2013-10-01 DIAGNOSIS — M129 Arthropathy, unspecified: Secondary | ICD-10-CM | POA: Insufficient documentation

## 2013-10-01 DIAGNOSIS — Y9289 Other specified places as the place of occurrence of the external cause: Secondary | ICD-10-CM | POA: Insufficient documentation

## 2013-10-01 DIAGNOSIS — E785 Hyperlipidemia, unspecified: Secondary | ICD-10-CM | POA: Insufficient documentation

## 2013-10-01 DIAGNOSIS — Z872 Personal history of diseases of the skin and subcutaneous tissue: Secondary | ICD-10-CM | POA: Insufficient documentation

## 2013-10-01 DIAGNOSIS — Z8673 Personal history of transient ischemic attack (TIA), and cerebral infarction without residual deficits: Secondary | ICD-10-CM | POA: Insufficient documentation

## 2013-10-01 DIAGNOSIS — W1809XA Striking against other object with subsequent fall, initial encounter: Secondary | ICD-10-CM | POA: Insufficient documentation

## 2013-10-01 DIAGNOSIS — K0889 Other specified disorders of teeth and supporting structures: Secondary | ICD-10-CM

## 2013-10-01 DIAGNOSIS — Y9301 Activity, walking, marching and hiking: Secondary | ICD-10-CM | POA: Insufficient documentation

## 2013-10-01 DIAGNOSIS — R55 Syncope and collapse: Secondary | ICD-10-CM

## 2013-10-01 DIAGNOSIS — J45909 Unspecified asthma, uncomplicated: Secondary | ICD-10-CM | POA: Insufficient documentation

## 2013-10-01 DIAGNOSIS — Z87891 Personal history of nicotine dependence: Secondary | ICD-10-CM | POA: Insufficient documentation

## 2013-10-01 DIAGNOSIS — Z7982 Long term (current) use of aspirin: Secondary | ICD-10-CM | POA: Insufficient documentation

## 2013-10-01 DIAGNOSIS — T148XXA Other injury of unspecified body region, initial encounter: Secondary | ICD-10-CM

## 2013-10-01 DIAGNOSIS — I498 Other specified cardiac arrhythmias: Secondary | ICD-10-CM | POA: Insufficient documentation

## 2013-10-01 DIAGNOSIS — I658 Occlusion and stenosis of other precerebral arteries: Secondary | ICD-10-CM

## 2013-10-01 DIAGNOSIS — I6523 Occlusion and stenosis of bilateral carotid arteries: Secondary | ICD-10-CM

## 2013-10-01 LAB — COMPREHENSIVE METABOLIC PANEL
ALBUMIN: 4.2 g/dL (ref 3.5–5.2)
ALT: 22 U/L (ref 0–53)
AST: 39 U/L — AB (ref 0–37)
Alkaline Phosphatase: 21 U/L — ABNORMAL LOW (ref 39–117)
BUN: 15 mg/dL (ref 6–23)
CALCIUM: 9.8 mg/dL (ref 8.4–10.5)
CO2: 22 mEq/L (ref 19–32)
CREATININE: 0.79 mg/dL (ref 0.50–1.35)
Chloride: 96 mEq/L (ref 96–112)
GFR calc Af Amer: >90 mL/min (ref >90)
GFR calc non Af Amer: >90 mL/min (ref >90)
Glucose, Bld: 68 mg/dL — ABNORMAL LOW (ref 70–99)
Potassium: 4.1 mEq/L (ref 3.7–5.3)
Sodium: 138 mEq/L (ref 137–147)
TOTAL PROTEIN: 7.7 g/dL (ref 6.0–8.3)
Total Bilirubin: 0.5 mg/dL (ref 0.3–1.2)

## 2013-10-01 LAB — URINALYSIS, ROUTINE W REFLEX MICROSCOPIC
BILIRUBIN URINE: NEGATIVE
GLUCOSE, UA: NEGATIVE mg/dL
HGB URINE DIPSTICK: NEGATIVE
Ketones, ur: NEGATIVE mg/dL
Leukocytes, UA: NEGATIVE
Nitrite: NEGATIVE
PH: 7 (ref 5.0–8.0)
Protein, ur: NEGATIVE mg/dL
SPECIFIC GRAVITY, URINE: 1.009 (ref 1.005–1.030)
Urobilinogen, UA: 0.2 mg/dL (ref 0.0–1.0)

## 2013-10-01 LAB — CBC WITH DIFFERENTIAL/PLATELET
BASOS ABS: 0 10*3/uL (ref 0.0–0.1)
BASOS PCT: 0 % (ref 0–1)
EOS PCT: 0 % (ref 0–5)
Eosinophils Absolute: 0 10*3/uL (ref 0.0–0.7)
HCT: 44.4 % (ref 39.0–52.0)
Hemoglobin: 15.9 g/dL (ref 13.0–17.0)
Lymphocytes Relative: 16 % (ref 12–46)
Lymphs Abs: 1.9 10*3/uL (ref 0.7–4.0)
MCH: 32.3 pg (ref 26.0–34.0)
MCHC: 35.8 g/dL (ref 30.0–36.0)
MCV: 90.1 fL (ref 78.0–100.0)
MONO ABS: 1.1 10*3/uL — AB (ref 0.1–1.0)
Monocytes Relative: 9 % (ref 3–12)
Neutro Abs: 9.3 10*3/uL — ABNORMAL HIGH (ref 1.7–7.7)
Neutrophils Relative %: 75 % (ref 43–77)
Platelets: 386 10*3/uL (ref 150–400)
RBC: 4.93 MIL/uL (ref 4.22–5.81)
RDW: 12.7 % (ref 11.5–15.5)
WBC: 12.4 10*3/uL — ABNORMAL HIGH (ref 4.0–10.5)

## 2013-10-01 MED ORDER — ACETAMINOPHEN 500 MG PO TABS
1000.0000 mg | ORAL_TABLET | Freq: Once | ORAL | Status: AC
  Administered 2013-10-01: 1000 mg via ORAL
  Filled 2013-10-01: qty 2

## 2013-10-01 MED ORDER — SODIUM CHLORIDE 0.9 % IV SOLN
1000.0000 mL | Freq: Once | INTRAVENOUS | Status: AC
  Administered 2013-10-01: 1000 mL via INTRAVENOUS

## 2013-10-01 MED ORDER — LOSARTAN POTASSIUM 50 MG PO TABS: 50.0000 mg | ORAL_TABLET | Freq: Every day | ORAL | Status: DC

## 2013-10-01 MED ORDER — SODIUM CHLORIDE 0.9 % IV SOLN
1000.0000 mL | INTRAVENOUS | Status: DC
  Administered 2013-10-01: 1000 mL via INTRAVENOUS

## 2013-10-01 NOTE — ED Notes (Signed)
Abigail PA-C at bedside sewing up laceration, pt HR and BP decreased pt c/o dizziness to PA. RN informed Dr. Jodi MourningZavitz, Pt BP taken via doppler BP systolic 122/ BP HR 67bpm. Pt automatic BP taken- WNL

## 2013-10-01 NOTE — Consult Note (Signed)
[facility] Medical Consultation  Scott Ayala ZOX:[id number] DOB: 04-09-1946 DOA: 10/01/2013 PCP: [name], MD   Requesting physician: Dr. Jodi Mourning Date of consultation: 10/01/13 Reason for consultation: transient bradycardia  Impression/Recommendations 1-pre-syncope and transient bradycardia: -appears to be all related to orthostatic changes and volume depletion from use of HCTZ -recommend stop HCTZ -start  Losartan 50mg  daily for BP control -advised to follow low sodium diet (less than 2.5 grams daily) -to be thorough and r/o any underlying rhythm abnormality, will set up appointment for next week for event monitoring -ok to discharge home from IM stand point -advised to maintain appropriate hydration  2-HTN: fairly stable. Will continue now on losartan daily and low sodium diet for control  3-HLD: continue statins  4-carotid stenosis: continue statins and ASA  5- asthma: continue home pulmicort and PRN albuterol   Ok to be discharge from ED with PCP follow up next week and also appointment for event monitor study. Thanks for this consult.  Chief Complaint: pre-syncope and bradycardia (transient)  HPI:  68 y/o with hx of HTN, carotid artery stenosis, asthma and HLD; came to ED complaining of pre-syncope event and face laceration. Patient recently admitted for presumed TIA and has been started on full dose ASA and full dose HCTZ for BP control. Patient reprorted that after discharge he has been experiencing mild lightheadedness sensation when changing positions and also muscle cramps. On the day of admission he woke up to use the bathroom and almost pass out; patient ended hitting his Chin against night stand and had a facial laceration. He denies fever, chills, palpitations, CP, or SOB. While in ED when he was placed flat to suture his chin he experienced transient event of flushing sensation, bradycardia and hypotension; which spontaneously resolved. [facility] called to  assess patient. Blood work WNL; recent admission with normal telemetry, MRI and CT head; blood work today essentially WNL. Patient EKG w/o acute ischemia or abnormalities (HR 53).  Review of Systems:  Negative except as mentioned on HPI.  Past Medical History  Diagnosis Date  . Hyperlipidemia   . Asthma   . History of actinic keratosis   . Arthritis    Past Surgical History  Procedure Laterality Date  . Vasectomy     Social History:  reports that he has quit smoking. He does not have any smokeless tobacco history on file. He reports that he drinks alcohol. He reports that he does not use illicit drugs.  Allergies  Allergen Reactions  . Flovent [Fluticasone] Other (See Comments)    "my voice got horse"  . Lovastatin     myalgias   Family History  Problem Relation Age of Onset  . Stroke Mother   . Stroke Father   . Hypertension Father   . Heart disease Father   . Heart disease Sister   . Heart disease Brother   . Heart disease Brother   . Hyperlipidemia Brother     Prior to Admission medications   Medication Sig Start Date End Date Taking? Authorizing Provider  albuterol (VENTOLIN HFA) 108 (90 BASE) MCG/ACT inhaler Inhale 2 puffs into the lungs every 6 (six) hours as needed for wheezing. 07/29/13  Yes [name], MD  aspirin 325 MG tablet Take 325 mg by mouth at bedtime.   Yes Historical Provider, MD  budesonide (PULMICORT) 180 MCG/ACT inhaler Inhale 2 puffs into the lungs 2 (two) times daily.   Yes Historical Provider, MD  hydrochlorothiazide (HYDRODIURIL) 25 MG tablet Take 1 tablet (25 mg total)  by mouth daily. 09/28/13  Yes Maryruth Bunhristina P Rama, MD  levothyroxine (SYNTHROID, LEVOTHROID) 50 MCG tablet Take 1 tablet (50 mcg total) by mouth daily before breakfast. 07/29/13  Yes Elvina SidleKurt Lauenstein, MD  mometasone (NASONEX) 50 MCG/ACT nasal spray Place 2 sprays into the nose daily. 07/29/13  Yes Elvina SidleKurt Lauenstein, MD  ondansetron (ZOFRAN) 8 MG tablet Take 8 mg by mouth every 8  (eight) hours as needed for nausea or vomiting.   Yes Historical Provider, MD  simvastatin (ZOCOR) 10 MG tablet Take 1 tablet (10 mg total) by mouth daily at 6 PM. 09/28/13  Yes Maryruth Bunhristina P Rama, MD   Physical Exam: Blood pressure 127/67, pulse 76, temperature 98.2 F (36.8 C), temperature source Oral, resp. rate 18, height 5\' 8"  (1.727 m), weight 97.523 kg (215 lb), SpO2 96.00%. Filed Vitals:   10/01/13 1445  BP: 127/67  Pulse: 76  Temp:   Resp: 18     General:  AAOX3, no acute distress; denies lightheadedness or HA's   Eyes: PERRL, no nystagmus, no icterus  ENT: no erythema or exudates inside his mouth; chin with sutured laceration; no drainage out of ears or nostrils  Neck: no bruits, no JVD  Cardiovascular: RRR (HR65 on exam), no rubs, no gallops, no murmurs  Respiratory: CTA bilaterally  Abdomen: soft, NT, ND, positive BS  Skin: chin lacerations; right arm bruises from recent IV line; otherwise no rash or petechiae  Musculoskeletal: FROM, no edema, no cyanosis  Psychiatric: appropriate  Neurologic: no focal deficit; grossly intact  Labs on Admission:  Basic Metabolic Panel:  Recent Labs Lab 09/27/13 1230 09/28/13 0035 10/01/13 1337  NA 137  --  138  K 5.3  --  4.1  CL 101  --  96  CO2 23  --  22  GLUCOSE 91  --  68*  BUN 11  --  15  CREATININE 0.81 0.84 0.79  CALCIUM 9.1  --  9.8   Liver Function Tests:  Recent Labs Lab 10/01/13 1337  AST 39*  ALT 22  ALKPHOS 21*  BILITOT 0.5  PROT 7.7  ALBUMIN 4.2   CBC:  Recent Labs Lab 09/27/13 1230 09/28/13 0035 10/01/13 1337  WBC 7.3 5.9 12.4*  NEUTROABS 3.9  --  9.3*  HGB 15.2 14.2 15.9  HCT 43.4 40.8 44.4  MCV 92.3 91.7 90.1  PLT 299 320 386    Radiological Exams on Admission: Ct Head Wo Contrast  10/01/2013   CLINICAL DATA:  Severe headache, chin laceration  EXAM: CT HEAD WITHOUT CONTRAST  CT MAXILLOFACIAL WITHOUT CONTRAST  TECHNIQUE: Multidetector CT imaging of the head and maxillofacial  structures were performed using the standard protocol without intravenous contrast. Multiplanar CT image reconstructions of the maxillofacial structures were also generated.  COMPARISON:  MR HEAD W/O CM dated 09/27/2013; MR MRA HEAD W/O CM dated 09/27/2013; CT HEAD W/O CM dated 09/27/2013  FINDINGS: CT HEAD FINDINGS  There is no evidence of mass effect, midline shift or extra-axial fluid collections. There is no evidence of a space-occupying lesion or intracranial hemorrhage. There is no evidence of a cortical-based area of acute infarction. There is periventricular white matter low attenuation likely secondary to microangiopathy. There is generalized cerebral atrophy.  The ventricles and sulci are appropriate for the patient's age. The basal cisterns are patent.  Visualized portions of the orbits are unremarkable. The visualized portions of the paranasal sinuses and mastoid air cells are unremarkable.  The osseous structures are unremarkable.  CT MAXILLOFACIAL FINDINGS  There  is a midline chin laceration. There is a small locular of subcutaneous emphysema.  The globes are intact. The orbital walls are intact. The orbital floors are intact. The maxilla is intact. The mandible is intact. The zygomatic arches are intact. The nasal septum is midline. There is no nasal bone fracture. The temporomandibular joints are normal.  The paranasal sinuses are clear. The visualized portions of the mastoid sinuses are well aerated.  On the sagittal reformatted images there is a mild broad-based disc bulge at C3-4. Partially visualized is degenerative disc disease and bilateral uncovertebral degenerative changes with foraminal stenosis at C5-6.  IMPRESSION: 1. No acute intracranial pathology. 2. No acute osseous injury the maxillofacial bones. 3. Partially visualized is cervical spondylosis as described above.   Electronically Signed   By: Elige Ko   On: 10/01/2013 09:45   Ct Maxillofacial Wo Cm  10/01/2013   CLINICAL DATA:   Severe headache, chin laceration  EXAM: CT HEAD WITHOUT CONTRAST  CT MAXILLOFACIAL WITHOUT CONTRAST  TECHNIQUE: Multidetector CT imaging of the head and maxillofacial structures were performed using the standard protocol without intravenous contrast. Multiplanar CT image reconstructions of the maxillofacial structures were also generated.  COMPARISON:  MR HEAD W/O CM dated 09/27/2013; MR MRA HEAD W/O CM dated 09/27/2013; CT HEAD W/O CM dated 09/27/2013  FINDINGS: CT HEAD FINDINGS  There is no evidence of mass effect, midline shift or extra-axial fluid collections. There is no evidence of a space-occupying lesion or intracranial hemorrhage. There is no evidence of a cortical-based area of acute infarction. There is periventricular white matter low attenuation likely secondary to microangiopathy. There is generalized cerebral atrophy.  The ventricles and sulci are appropriate for the patient's age. The basal cisterns are patent.  Visualized portions of the orbits are unremarkable. The visualized portions of the paranasal sinuses and mastoid air cells are unremarkable.  The osseous structures are unremarkable.  CT MAXILLOFACIAL FINDINGS  There is a midline chin laceration. There is a small locular of subcutaneous emphysema.  The globes are intact. The orbital walls are intact. The orbital floors are intact. The maxilla is intact. The mandible is intact. The zygomatic arches are intact. The nasal septum is midline. There is no nasal bone fracture. The temporomandibular joints are normal.  The paranasal sinuses are clear. The visualized portions of the mastoid sinuses are well aerated.  On the sagittal reformatted images there is a mild broad-based disc bulge at C3-4. Partially visualized is degenerative disc disease and bilateral uncovertebral degenerative changes with foraminal stenosis at C5-6.  IMPRESSION: 1. No acute intracranial pathology. 2. No acute osseous injury the maxillofacial bones. 3. Partially visualized is  cervical spondylosis as described above.   Electronically Signed   By: Elige Ko   On: 10/01/2013 09:45    EKG:  Sinus rhythm, HR 53; no ischemic changes appreciated.  Time spent: 45 minutes  Lolah Coghlan [facility] Pager [phone]  If 7PM-7AM, please contact night-coverage [email or web address] Password [facility] 10/01/2013, 3:14 PM

## 2013-10-01 NOTE — ED Notes (Signed)
MD at bedside. 

## 2013-10-01 NOTE — ED Provider Notes (Addendum)
Medical screening examination/treatment/procedure(s) were conducted as a shared visit with non-physician practitioner(s) or resident and myself. I personally evaluated the patient during the encounter and agree with the findings and plan unless otherwise indicated.  I have personally reviewed any xrays and/ or EKG's with the provider and I agree with interpretation.  Recent flu sxs and decreased po intake, recent admission for TIA work up. Pt had lightheaded and nausea at the end of urination then stood up, walked and felt lightheaded has brief possible syncope and hit chin on the ground. No cp or sob. Chin lac irregular, mild gaping. No GI bleeding noted. Pt feels at baseline currently. Plan for basic labs, fluids and EKG. Likely prerenal/ vasovagal. RRR, no murmurs, reviewed recent echo, no AS.  Loose upper anterior incisor, neck supple, neuro intact, no murmurs, RRR.  Clinically neurogenic/ vasovagal. Pt had another episode with hypotension/ brady during lac repair, returned to baseline, repeat EKG reviewed, no heart block. COncern for vasovagal vs sick sinus vs other. Lac repair in ED by PA.  Dental repair- dermabond and splint for subluxed tooth.  Long discussion with pt, hospitalist (PA) to arrange close fup and holter.   EKG Interpretation    Date/Time: Friday October 01 2013 08:24:33 EST  Ventricular Rate: 74  PR Interval: 184  QRS Duration: 84  QT Interval: 416  QTC Calculation: 461  R Axis: 1  Text Interpretation: Sinus rhythm No acute findings Confirmed by Osmel Dykstra MD, Dennis Killilea ([id number]) on 10/01/2013 11:56:33 AM      Syncope, Chin laceration, Tooth subluxation, Head injury Dehydration   Scott SkeensJoshua M Khristina Janota, MD 10/01/13 1747  Scott SkeensJoshua M Tajae Rybicki, MD 10/01/13 1747

## 2013-10-01 NOTE — ED Notes (Signed)
Pt states he woke up to urinate. While he was urinating he felt dizzy.  He sat on the floor briefly until he felt better.  As he reached his room, he began feeling dizzy again and hit head against the bedside table.  Does not feel he passed out.  Recent episode of vision blurring with dx of poss tia vs migraine.  Recent hx of flu.

## 2013-10-01 NOTE — ED Provider Notes (Signed)
CSN: [id number]     Arrival date & time 10/01/13  0806 History   First MD Initiated Contact with Patient 10/01/13 [phone]     Chief Complaint  Patient presents with  . Near Syncope  . Facial Laceration     (Consider location/radiation/quality/duration/timing/severity/associated sxs/prior Treatment) HPI  This is a 68 year old male who presents emergency department for syncope, facial laceration.  The patient was recently admitted on Monday, 09/27/2013 for TIA workup for complaint of central vision changes for approximately 20 minutes.  The patient had a history of untreated hypertension and hyperlipidemia at that time.  Patient states that he awoke this morning to urinate.  CT did not have the lights on and sat on the commode.  He states after urination he had sudden presyncope prodrome of flushing, dizziness.  He states he sat still and not move until he was able.  He then walked down the hall to his room.  He walked in and lost consciousness.  Patient awoke when hitting his chin and teeth against his night stand.  The patient denies a history of syncope. He denies black or tarry stools.  Patient denies racing or skipping in his heart.  Patient states that he did have the flu last week and had profuse vomiting for several days. Chart review shows normal MRI done for his TIA workup.  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  The cardiogram showed an EF of 55-60% without wall motion abnormality and was otherwise normal.  The patient was begun on hydrochlorothiazide. UTD on his tetanus.  Past Medical History  Diagnosis Date  . Hyperlipidemia   . Asthma   . History of actinic keratosis   . Arthritis    Past Surgical History  Procedure Laterality Date  . Vasectomy     Family History  Problem Relation Age of Onset  . Stroke Mother   . Stroke Father   . Hypertension Father   . Heart disease Father   . Heart disease Sister   . Heart disease Brother     . Heart disease Brother   . Hyperlipidemia Brother    History  Substance Use Topics  . Smoking status: Former Games developermoker  . Smokeless tobacco: Not on file  . Alcohol Use: Yes     Comment: 15 to 20 drinks per week    Review of Systems  Ten systems reviewed and are negative for acute change, except as noted in the HPI.   Allergies  Lovastatin and Procaine hcl  Home Medications   Current Outpatient Rx  Name  Route  Sig  Dispense  Refill  . albuterol (VENTOLIN HFA) 108 (90 BASE) MCG/ACT inhaler   Inhalation   Inhale 2 puffs into the lungs every 6 (six) hours as needed for wheezing.   18 each   11   . aspirin 81 MG chewable tablet   Oral   Chew 4 tablets (324 mg total) by mouth daily.         . budesonide (PULMICORT) 180 MCG/ACT inhaler   Inhalation   Inhale 2 puffs into the lungs 2 (two) times daily.         . hydrochlorothiazide (HYDRODIURIL) 25 MG tablet   Oral   Take 1 tablet (25 mg total) by mouth daily.   30 tablet   3   . levothyroxine (SYNTHROID, LEVOTHROID) 50 MCG tablet   Oral   Take 1 tablet (50 mcg total) by mouth daily before  breakfast.   90 tablet   3   . mometasone (NASONEX) 50 MCG/ACT nasal spray   Nasal   Place 2 sprays into the nose daily.   17 g   12   . ondansetron (ZOFRAN) 8 MG tablet   Oral   Take 8 mg by mouth every 8 (eight) hours as needed for nausea or vomiting.         . simvastatin (ZOCOR) 10 MG tablet   Oral   Take 1 tablet (10 mg total) by mouth daily at 6 PM.   30 tablet   3    There were no vitals taken for this visit. Physical Exam  Nursing note and vitals reviewed. Constitutional: He appears well-developed and well-nourished. No distress.  HENT:  Head: Normocephalic.    Mouth/Throat:    Eyes: Conjunctivae are normal. No scleral icterus.  Neck: Normal range of motion. Neck supple.  Cardiovascular: Normal rate, regular rhythm and normal heart sounds.   Pulmonary/Chest: Effort normal and breath sounds normal.  No respiratory distress.  Abdominal: Soft. There is no tenderness.  Musculoskeletal: He exhibits no edema.  Neurological: He is alert.  Skin: Skin is warm and dry. He is not diaphoretic.  Psychiatric: His behavior is normal.     ED Course  Dental Date/Time: 10/01/2013 2:16 PM Performed by: Arthor Captain Authorized by: Arthor Captain Consent: Verbal consent obtained. Risks and benefits: risks, benefits and alternatives were discussed Local anesthesia used: no Patient tolerance: Patient tolerated the procedure well with no immediate complications. Comments: Dentition cleaned of blood and dried thoroughly.  Dermabond was applied to the    (including critical care time)  LACERATION REPAIR Performed by: Arthor Captain Authorized by: Arthor Captain Consent: Verbal consent obtained. Risks and benefits: risks, benefits and alternatives were discussed Consent given by: patient Patient identity confirmed: provided demographic data Prepped and Draped in normal sterile fashion Wound explored  Laceration Location: chin  Laceration Length: 6cm  No Foreign Bodies seen or palpated  Anesthesia: local infiltration  Local anesthetic: lidocaine 2% w epinephrine  Anesthetic total: 6 ml  Irrigation method: syringe Amount of cleaning: standard  Skin closure: 5.0 vicryl rapide, 5.0 Prolene  Number of sutures: 9  Technique: 2 sub q; 7 SI  Patient tolerance: Patient tolerated the procedure well with no immediate complications.   Labs Review Labs Reviewed  POCT CBG (FASTING - GLUCOSE)-MANUAL ENTRY   Imaging Review No results found.  EKG Interpretation   None       MDM   Final diagnoses:  Syncope and collapse  Laceration  Subluxation of tooth    Patient with syncope and collapse.  Patient's labs done 3 days ago and without abnormality. CTs are negative.  12:30 PM During laceration repair.The patient became bradycardic to 48 and hypotensive to to  71/47. Repeat EKG shows NSR without abnormalities. No reason to suspect sepsis. ? possible vagal agent vs. SSS. Patient does not feel comfortable leaving.   1:40 PM I have spoken with Dr. Gwenlyn Perking who does not feel the patient needs admission at this point. He has agreed to see the patient for evaluation here in the ED.  3:00PM  I have spoken to Dr. Gwenlyn Perking who states He is in contact with Dr. Graciela Husbands regarding OP holter monitoring appt. And waiting for call back. Patient informed.   4:30 PM Patient will be discharged with plans to d/c his HCTZ. Begin Losartan 50 mg. Low sodium diet. Follow up with dentist on Monday. Stitches out in 7  days. Office of Dr. Graciela Husbands will contact patient for appointment on Monday.  Return precautions discussed     Arthor Captain, PA-C 10/01/13 1716

## 2013-10-01 NOTE — Discharge Instructions (Signed)
Please discontinue your HCTZ. Begin taking 50 mg losartan daily for your BP. Please follow a low sodium diet (less than 2.5 g a day) Please maintain appropriate hydration. Have your stitches removed in 7 days. Syncope Syncope is a fainting spell. This means the person loses consciousness and drops to the ground. The person is generally unconscious for less than 5 minutes. The person may have some muscle twitches for up to 15 seconds before waking up and returning to normal. Syncope occurs more often in elderly people, but it can happen to anyone. While most causes of syncope are not dangerous, syncope can be a sign of a serious medical problem. It is important to seek medical care.  CAUSES  Syncope is caused by a sudden decrease in blood flow to the brain. The specific cause is often not determined. Factors that can trigger syncope include:  Taking medicines that lower blood pressure.  Sudden changes in posture, such as standing up suddenly.  Taking more medicine than prescribed.  Standing in one place for too long.  Seizure disorders.  Dehydration and excessive exposure to heat.  Low blood sugar (hypoglycemia).  Straining to have a bowel movement.  Heart disease, irregular heartbeat, or other circulatory problems.  Fear, emotional distress, seeing blood, or severe pain. SYMPTOMS  Right before fainting, you may:  Feel dizzy or lightheaded.  Feel nauseous.  See all white or all black in your field of vision.  Have cold, clammy skin. DIAGNOSIS  Your caregiver will ask about your symptoms, perform a physical exam, and perform electrocardiography (ECG) to record the electrical activity of your heart. Your caregiver may also perform other heart or blood tests to determine the cause of your syncope. TREATMENT  In most cases, no treatment is needed. Depending on the cause of your syncope, your caregiver may recommend changing or stopping some of your medicines. HOME CARE  INSTRUCTIONS  Have someone stay with you until you feel stable.  Do not drive, operate machinery, or play sports until your caregiver says it is okay.  Keep all follow-up appointments as directed by your caregiver.  Lie down right away if you start feeling like you might faint. Breathe deeply and steadily. Wait until all the symptoms have passed.  Drink enough fluids to keep your urine clear or pale yellow.  If you are taking blood pressure or heart medicine, get up slowly, taking several minutes to sit and then stand. This can reduce dizziness. SEEK IMMEDIATE MEDICAL CARE IF:   You have a severe headache.  You have unusual pain in the chest, abdomen, or back.  You are bleeding from the mouth or rectum, or you have black or tarry stool.  You have an irregular or very fast heartbeat.  You have pain with breathing.  You have repeated fainting or seizure-like jerking during an episode.  You faint when sitting or lying down.  You have confusion.  You have difficulty walking.  You have severe weakness.  You have vision problems. If you fainted, call your local emergency services (911 in [place] Do not drive yourself to the hospital.  MAKE SURE YOU:  Understand these instructions.  Will watch your condition.  Will get help right away if you are not doing well or get worse. Document Released: 08/05/2005 Document Revised: 02/04/2012 Document Reviewed: 10/04/2011 [facility] Patient Information 2014 [place], [place].   Cardiac Event Monitoring A cardiac event monitor is a small recording device used to help detect abnormal heart rhythms (arrhythmias). The monitor is  used to record heart rhythm when noticeable symptoms such as the following occur:  Fast heart beats (palpitations), such as heart racing or fluttering.  Dizziness.  Fainting or lightheadedness.  Unexplained weakness. The monitor is wired to two electrodes placed on your chest. Electrodes are flat, sticky disks  that attach to your skin. The monitor can be worn for up to 30 days. You will wear the monitor at all times, except when bathing.  HOW TO USE YOUR CARDIAC EVENT MONITOR A technician will prepare your chest for the electrode placement. The technician will show you how to place the electrodes, how to work the monitor, and how to replace the batteries. Take time to practice using the monitor before you leave the office. Make sure you understand how to send the information from the monitor to your health care provider. This requires a telephone with a landline, not a cellphone. You need to:  Wear your monitor at all times, except when you are in water:  Do not get the monitor wet.  Take the monitor off when bathing. Do not swim or use a hot tub with it on.  Keep your skin clean. Do not put body lotion or moisturizer on your chest.  Change the electrodes daily or any time they stop sticking to your skin. You might need to use tape to keep them on.  It is possible that your skin under the electrodes could become irritated. To keep this from happening, try to put the electrodes in slightly different places on your chest. However, they must remain in the area under your left breast and in the upper right section of your chest.  Make sure the monitor is safely clipped to your clothing or in a location close to your body that your health care provider recommends.  Press the button to record when you feel symptoms of heart trouble, such as dizziness, weakness, lightheadedness, palpitations, thumping, shortness of breath, unexplained weakness, or a fluttering or racing heart. The monitor is always on and records what happened slightly before you pressed the button, so do not worry about being too late to get good information.  Keep a diary of your activities, such as walking, doing chores, and taking medicine. It is especially important to note what you were doing when you pushed the button to record your  symptoms. This will help your health care provider determine what might be contributing to your symptoms. The information stored in your monitor will be reviewed by your health care provider alongside your diary entries.  Send the recorded information as recommended by your health care provider. It is important to understand that it will take some time for your health care provider to process the results.  Change the batteries as recommended by your health care provider. SEEK IMMEDIATE MEDICAL CARE IF:   You have chest pain.  You have extreme difficulty breathing or shortness of breath.  You develop a very fast heartbeat that persists.  You develop dizziness that does not go away .  You faint or constantly feel you are about to faint. Document Released: 05/14/2008 Document Revised: 04/07/2013 Document Reviewed: 02/01/2013 [facility] Patient Information 2014 [place], [place].   Hypertension    As your heart beats, it forces blood through your arteries. This force is your blood pressure. If the pressure is too high, it is called hypertension (HTN) or high blood pressure. HTN is dangerous because you may have it and not know it. High blood pressure may mean that your  heart has to work harder to pump blood. Your arteries may be narrow or stiff. The extra work puts you at risk for heart disease, stroke, and other problems.  Blood pressure consists of two numbers, a higher number over a lower, 110/72, for example. It is stated as "110 over 72." The ideal is below 120 for the top number (systolic) and under 80 for the bottom (diastolic). Write down your blood pressure today.  You should pay close attention to your blood pressure if you have certain conditions such as:  Heart failure.  Prior heart attack.  Diabetes  Chronic kidney disease.  Prior stroke.  Multiple risk factors for heart disease. To see if you have HTN, your blood pressure should be measured while you are seated with your arm  held at the level of the heart. It should be measured at least twice. A one-time elevated blood pressure reading (especially in the Emergency Department) does not mean that you need treatment. There may be conditions in which the blood pressure is different between your right and left arms. It is important to see your caregiver soon for a recheck.  Most people have essential hypertension which means that there is not a specific cause. This type of high blood pressure may be lowered by changing lifestyle factors such as:  Stress.  Smoking.  Lack of exercise.  Excessive weight.  Drug/tobacco/alcohol use.  Eating less salt. Most people do not have symptoms from high blood pressure until it has caused damage to the body. Effective treatment can often prevent, delay or reduce that damage.  TREATMENT  When a cause has been identified, treatment for high blood pressure is directed at the cause. There are a large number of medications to treat HTN. These fall into several categories, and your caregiver will help you select the medicines that are best for you. Medications may have side effects. You should review side effects with your caregiver.  If your blood pressure stays high after you have made lifestyle changes or started on medicines,  Your medication(s) may need to be changed.  Other problems may need to be addressed.  Be certain you understand your prescriptions, and know how and when to take your medicine.  Be sure to follow up with your caregiver within the time frame advised (usually within two weeks) to have your blood pressure rechecked and to review your medications.  If you are taking more than one medicine to lower your blood pressure, make sure you know how and at what times they should be taken. Taking two medicines at the same time can result in blood pressure that is too low. SEEK IMMEDIATE MEDICAL CARE IF:  You develop a severe headache, blurred or changing vision, or confusion.  You  have unusual weakness or numbness, or a faint feeling.  You have severe chest or abdominal pain, vomiting, or breathing problems. MAKE SURE YOU:  Understand these instructions.  Will watch your condition.  Will get help right away if you are not doing well or get worse. Document Released: 08/05/2005 Document Revised: 10/28/2011 Document Reviewed: 03/25/2008  [facility] Patient Information 2014 [place].    DASH Diet  The DASH diet stands for "Dietary Approaches to Stop Hypertension." It is a healthy eating plan that has been shown to reduce high blood pressure (hypertension) in as little as 14 days, while also possibly providing other significant health benefits. These other health benefits include reducing the risk of breast cancer after menopause and reducing the risk  of type 2 diabetes, heart disease, colon cancer, and stroke. Health benefits also include weight loss and slowing kidney failure in patients with chronic kidney disease.  DIET GUIDELINES  Limit salt (sodium). Your diet should contain less than 1500 mg of sodium daily.  Limit refined or processed carbohydrates. Your diet should include mostly whole grains. Desserts and added sugars should be used sparingly.  Include small amounts of heart-healthy fats. These types of fats include nuts, oils, and tub margarine. Limit saturated and trans fats. These fats have been shown to be harmful in the body. CHOOSING FOODS  The following food groups are based on a 2000 calorie diet. See your Registered Dietitian for individual calorie needs.  Grains and Grain Products (6 to 8 servings daily)  Eat More Often: Whole-wheat bread, brown rice, whole-grain or wheat pasta, quinoa, popcorn without added fat or salt (air popped).  Eat Less Often: White bread, white pasta, white rice, cornbread. Vegetables (4 to 5 servings daily)  Eat More Often: Fresh, frozen, and canned vegetables. Vegetables may be raw, steamed, roasted, or grilled with a  minimal amount of fat.  Eat Less Often/Avoid: Creamed or fried vegetables. Vegetables in a cheese sauce. Fruit (4 to 5 servings daily)  Eat More Often: All fresh, canned (in natural juice), or frozen fruits. Dried fruits without added sugar. One hundred percent fruit juice ( cup [237 mL] daily).  Eat Less Often: Dried fruits with added sugar. Canned fruit in light or heavy syrup. [employer], Fish, and Poultry (2 servings or less daily. One serving is 3 to 4 oz [85-114 g]).  Eat More Often: Ninety percent or leaner ground beef, tenderloin, sirloin. Round cuts of beef, chicken breast, [place] breast. All fish. Grill, bake, or broil your meat. Nothing should be fried.  Eat Less Often/Avoid: Fatty cuts of meat, [place], or chicken leg, thigh, or wing. Fried cuts of meat or fish. Dairy (2 to 3 servings)  Eat More Often: Low-fat or fat-free milk, low-fat plain or light yogurt, reduced-fat or part-skim cheese.  Eat Less Often/Avoid: Milk (whole, 2%). Whole milk yogurt. Full-fat cheeses. Nuts, Seeds, and Legumes (4 to 5 servings per week)  Eat More Often: All without added salt.  Eat Less Often/Avoid: Salted nuts and seeds, canned beans with added salt. Fats and Sweets (limited)  Eat More Often: Vegetable oils, tub margarines without trans fats, sugar-free gelatin. Mayonnaise and salad dressings.  Eat Less Often/Avoid: Coconut oils, palm oils, butter, stick margarine, cream, half and half, cookies, candy, pie. FOR MORE INFORMATION  The Dash Diet Eating Plan: www.dashdiet.org  Document Released: 07/25/2011 Document Revised: 10/28/2011 Document Reviewed: 07/25/2011  [employer] Patient Information 2014 [employer], [place].    WOUND CARE Please have your stitches/staples removed in 7 days or sooner if you have concerns. You may do this at any available urgent care or at your primary care doctor's office.  Keep area clean and dry for 24 hours. Do not remove bandage, if applied.  After 24 hours, remove  bandage and wash wound gently with mild soap and warm water. Reapply a new bandage after cleaning wound, if directed.  Continue daily cleansing with soap and water until stitches/staples are removed.  Do not apply any ointments or creams to the wound while stitches/staples are in place, as this may cause delayed healing.  Seek medical careif you experience any of the following signs of infection: Swelling, redness, pus drainage, streaking, fever >101.0 F  Seek care if you experience excessive bleeding that does not  stop after 15-20 minutes of constant, firm pressure.

## 2013-10-06 ENCOUNTER — Ambulatory Visit (INDEPENDENT_AMBULATORY_CARE_PROVIDER_SITE_OTHER): Payer: BC Managed Care – PPO | Admitting: Emergency Medicine

## 2013-10-06 ENCOUNTER — Ambulatory Visit: Payer: BC Managed Care – PPO | Admitting: Family Medicine

## 2013-10-06 VITALS — BP 122/80 | HR 72 | Temp 98.3°F | Resp 17 | Wt 216.0 lb

## 2013-10-06 DIAGNOSIS — G43909 Migraine, unspecified, not intractable, without status migrainosus: Secondary | ICD-10-CM

## 2013-10-06 DIAGNOSIS — R55 Syncope and collapse: Secondary | ICD-10-CM

## 2013-10-06 DIAGNOSIS — G459 Transient cerebral ischemic attack, unspecified: Secondary | ICD-10-CM

## 2013-10-06 NOTE — Progress Notes (Addendum)
Subjective:    Patient ID: Scott Ayala, male    DOB: 10-05-1945, 68 y.o.   MRN: [id number] This chart was scribed for Lesle ChrisSteven Ravynn Hogate, MD by Danella Maiersaroline Early, ED Scribe. This patient was seen in room 13 and the patient's care was started at 9:30 AM.  Chief Complaint  Patient presents with  . Follow-up    hospital visits    HPI HPI Comments: Scott MemorySamuel E Mcfarlan is a 68 y.o. male who presents to the [facility] for a follow up for hospital visit for possible TIA and syncopal episode.  Pt states on 2/9 he lost vision in the center of his visual field, lasting 20 minutes, was evaluated at ER. He states all doctors except Dr Pearlean BrownieSethi with neurology thought he was having a TIA, but Sethi suspected migraine. He denies h/o migraines. He states both his parents died of stroke. He states he was discharged with probable TIA, put on a diuretic and aspirin. He states Sethi recommended f/u with ophthalmology.   He states he became dizzy on 2/13 and had a syncopal episode where he hit his chin on the night stand and suffered a laceration. Evaluated at ER, doctors said the episode was secondary to hypotension from being started on a diuretic. He states he remembers hitting the nightstand and does not think he completely lost consciousness. He states he recovered from the dizzy episode relatively quickly. He had stitches on the laceration to his chin, they will be taken out next week.  Past Medical History  Diagnosis Date  . Hyperlipidemia   . Asthma   . History of actinic keratosis   . Arthritis    Current Outpatient Prescriptions on File Prior to Visit  Medication Sig Dispense Refill  . albuterol (VENTOLIN HFA) 108 (90 BASE) MCG/ACT inhaler Inhale 2 puffs into the lungs every 6 (six) hours as needed for wheezing.  18 each  11  . budesonide (PULMICORT) 180 MCG/ACT inhaler Inhale 2 puffs into the lungs 2 (two) times daily.      Marland Kitchen. levothyroxine (SYNTHROID, LEVOTHROID) 50 MCG tablet Take 1 tablet  (50 mcg total) by mouth daily before breakfast.  90 tablet  3  . losartan (COZAAR) 50 MG tablet Take 1 tablet (50 mg total) by mouth daily.  30 tablet  0  . mometasone (NASONEX) 50 MCG/ACT nasal spray Place 2 sprays into the nose daily.  17 g  12  . simvastatin (ZOCOR) 10 MG tablet Take 1 tablet (10 mg total) by mouth daily at 6 PM.  30 tablet  3  . [DISCONTINUED] hydrochlorothiazide (HYDRODIURIL) 25 MG tablet Take 1 tablet (25 mg total) by mouth daily.  30 tablet  3   No current facility-administered medications on file prior to visit.   Allergies  Allergen Reactions  . Flovent [Fluticasone] Other (See Comments)    "my voice got horse"  . Lovastatin     myalgias      Review of Systems  Constitutional: Negative for fatigue and unexpected weight change.  Eyes: Negative for visual disturbance.  Respiratory: Negative for cough, chest tightness and shortness of breath.   Cardiovascular: Negative for chest pain, palpitations and leg swelling.  Gastrointestinal: Negative for abdominal pain and blood in stool.  Neurological: Negative for dizziness, light-headedness and headaches.       Objective:   Physical Exam CONSTITUTIONAL: Well developed/well nourished HEAD: Normocephalic/atraumatic EYES: EOMI/PERRL ENMT: Mucous membranes moist NECK: supple no meningeal signs SPINE:entire spine nontender CV: S1/S2 noted,  no murmurs/rubs/gallops noted LUNGS: Lungs are clear to auscultation bilaterally, no apparent distress ABDOMEN: soft, nontender, no rebound or guarding GU:no cva tenderness NEURO: Pt is awake/alert, moves all extremitiesx4 EXTREMITIES: pulses normal, full ROM SKIN: warm, color normal. 3 inch lac over chin with mild erythema over suture lines PSYCH: no abnormalities of mood noted  Filed Vitals:   10/06/13 0859  BP: 115/72  Pulse: 78  Temp: 98.3 F (36.8 C)  TempSrc: Oral  Resp: 17  Weight: 216 lb (97.977 kg)  SpO2: 98%         Assessment & Plan:  [facility]  ophthalmology for evaluation. Referral made to Dr. Pearlean Brownie. We'll call Dr. Odessa Fleming office and see if we can get patient in sooner to go ahead and have a heart monitor placed .  **Disclaimer: This note was dictated with voice recognition software. Similar sounding words can inadvertently be transcribed and this note may contain transcription errors which may not have been corrected upon publication of note.**  I personally performed the services described in this documentation, which was scribed in my presence. The recorded information has been reviewed and is accurate.

## 2013-10-07 ENCOUNTER — Encounter: Payer: Self-pay | Admitting: *Deleted

## 2013-10-07 ENCOUNTER — Encounter (INDEPENDENT_AMBULATORY_CARE_PROVIDER_SITE_OTHER): Payer: BC Managed Care – PPO

## 2013-10-07 DIAGNOSIS — I498 Other specified cardiac arrhythmias: Secondary | ICD-10-CM

## 2013-10-07 DIAGNOSIS — R001 Bradycardia, unspecified: Secondary | ICD-10-CM

## 2013-10-07 DIAGNOSIS — I959 Hypotension, unspecified: Secondary | ICD-10-CM

## 2013-10-07 DIAGNOSIS — R55 Syncope and collapse: Secondary | ICD-10-CM

## 2013-10-07 NOTE — Progress Notes (Unsigned)
Patient ID: Scott Ayala, male   DOB: 1945/12/22, 68 y.o.   MRN: [id number] 14 Day ZIO patch cardiac event recorder applied to patient.

## 2013-10-10 ENCOUNTER — Ambulatory Visit (INDEPENDENT_AMBULATORY_CARE_PROVIDER_SITE_OTHER): Payer: BC Managed Care – PPO | Admitting: Emergency Medicine

## 2013-10-10 VITALS — BP 108/70 | HR 74 | Temp 98.3°F | Ht 68.0 in | Wt 218.0 lb

## 2013-10-10 DIAGNOSIS — L03211 Cellulitis of face: Principal | ICD-10-CM

## 2013-10-10 DIAGNOSIS — Z4802 Encounter for removal of sutures: Secondary | ICD-10-CM

## 2013-10-10 DIAGNOSIS — L0201 Cutaneous abscess of face: Secondary | ICD-10-CM

## 2013-10-10 MED ORDER — SULFAMETHOXAZOLE-TMP DS 800-160 MG PO TABS: 1.0000 | ORAL_TABLET | Freq: Two times a day (BID) | ORAL | Status: DC

## 2013-10-10 NOTE — Patient Instructions (Signed)
Cellulitis Cellulitis is an infection of the skin and the tissue beneath it. The infected area is usually red and tender. Cellulitis occurs most often in the arms and lower legs.  CAUSES  Cellulitis is caused by bacteria that enter the skin through cracks or cuts in the skin. The most common types of bacteria that cause cellulitis are Staphylococcus and Streptococcus. SYMPTOMS   Redness and warmth.  Swelling.  Tenderness or pain.  Fever. DIAGNOSIS  Your caregiver can usually determine what is wrong based on a physical exam. Blood tests may also be done. TREATMENT  Treatment usually involves taking an antibiotic medicine. HOME CARE INSTRUCTIONS   Take your antibiotics as directed. Finish them even if you start to feel better.  Keep the infected arm or leg elevated to reduce swelling.  Apply a warm cloth to the affected area up to 4 times per day to relieve pain.  Only take over-the-counter or prescription medicines for pain, discomfort, or fever as directed by your caregiver.  Keep all follow-up appointments as directed by your caregiver. SEEK MEDICAL CARE IF:   You notice red streaks coming from the infected area.  Your red area gets larger or turns dark in color.  Your bone or joint underneath the infected area becomes painful after the skin has healed.  Your infection returns in the same area or another area.  You notice a swollen bump in the infected area.  You develop new symptoms. SEEK IMMEDIATE MEDICAL CARE IF:   You have a fever.  You feel very sleepy.  You develop vomiting or diarrhea.  You have a general ill feeling (malaise) with muscle aches and pains. MAKE SURE YOU:   Understand these instructions.  Will watch your condition.  Will get help right away if you are not doing well or get worse. Document Released: 05/15/2005 Document Revised: 02/04/2012 Document Reviewed: 10/21/2011 [employer] Patient Information 2014 [employer].  

## 2013-10-10 NOTE — Progress Notes (Signed)
[facility] [address], [place] [address] [phone]  Date:  10/10/2013   Name:  Scott Ayala   DOB:  03-Jun-1946   MRN:  [id number]  PCP:  [name], MD    Chief Complaint: Suture / Staple Removal   History of Present Illness:  Scott Ayala is a 68 y.o. very pleasant male patient who presents with the following:  Injured when he passed out while walking back from the bathroom and sutured on Friday a week ago in the ER.  Interval history is negative.  Now has pain, redness and swelling of the chin surrounding the wound. No improvement with over the counter medications or other home remedies. Denies other complaint or health concern today.   Patient Active Problem List   Diagnosis Date Noted  . Essential hypertension, benign 09/28/2013  . Bilateral carotid artery stenosis 0-39% 09/28/2013  . Visual distortion 09/27/2013  . TIA (transient ischemic attack) 09/27/2013  . Asthma 09/05/2011  . History of actinic keratosis   . Hyperlipidemia   . NONSPECIFIC ABNORMAL FINDING IN STOOL CONTENTS 04/25/2010  . PERSONAL HISTORY OF COLONIC POLYPS 04/25/2010    Past Medical History  Diagnosis Date  . Hyperlipidemia   . Asthma   . History of actinic keratosis   . Arthritis     Past Surgical History  Procedure Laterality Date  . Vasectomy      History  Substance Use Topics  . Smoking status: Former Games developer  . Smokeless tobacco: Not on file  . Alcohol Use: Yes     Comment: 15 to 20 drinks per week    Family History  Problem Relation Age of Onset  . Stroke Mother   . Stroke Father   . Hypertension Father   . Heart disease Father   . Heart disease Sister   . Heart disease Brother   . Heart disease Brother   . Hyperlipidemia Brother     Allergies  Allergen Reactions  . Flovent [Fluticasone] Other (See Comments)    "my voice got horse"  . Lovastatin     myalgias    Medication list has been reviewed and updated.  Current  Outpatient Prescriptions on File Prior to Visit  Medication Sig Dispense Refill  . albuterol (VENTOLIN HFA) 108 (90 BASE) MCG/ACT inhaler Inhale 2 puffs into the lungs every 6 (six) hours as needed for wheezing.  18 each  11  . aspirin 81 MG tablet Take 81 mg by mouth daily.      . budesonide (PULMICORT) 180 MCG/ACT inhaler Inhale 2 puffs into the lungs 2 (two) times daily.      Marland Kitchen levothyroxine (SYNTHROID, LEVOTHROID) 50 MCG tablet Take 1 tablet (50 mcg total) by mouth daily before breakfast.  90 tablet  3  . losartan (COZAAR) 50 MG tablet Take 1 tablet (50 mg total) by mouth daily.  30 tablet  0  . simvastatin (ZOCOR) 10 MG tablet Take 1 tablet (10 mg total) by mouth daily at 6 PM.  30 tablet  3  . mometasone (NASONEX) 50 MCG/ACT nasal spray Place 2 sprays into the nose daily.  17 g  12  . [DISCONTINUED] hydrochlorothiazide (HYDRODIURIL) 25 MG tablet Take 1 tablet (25 mg total) by mouth daily.  30 tablet  3   No current facility-administered medications on file prior to visit.    Review of Systems:  As per HPI, otherwise negative.    Physical Examination: Filed Vitals:   10/10/13 1126  BP: 108/70  Pulse: 74  Temp: 98.3 F (36.8 C)   Filed Vitals:   10/10/13 1126  Height: 5\' 8"  (1.727 m)  Weight: 218 lb (98.884 kg)   Body mass index is 33.15 kg/(m^2). Ideal Body Weight: Weight in (lb) to have BMI = 25: 164.1   GEN: WDWN, NAD, Non-toxic, Alert & Oriented x 3 HEENT: Atraumatic, Normocephalic.  Ears and Nose: No external deformity. EXTR: No clubbing/cyanosis/edema NEURO: Normal gait.  PSYCH: Normally interactive. Conversant. Not depressed or anxious appearing.  Calm demeanor.  Laceration chin healed with cellulitis surrounding the wound and some drainage in the dependent portion of the wound.    Sutures removed   Assessment and Plan: Suture removal of chin Cellulitis Septra DS Follow up Wednesday if not resolved.   Signed,  Phillips OdorJeffery Shakemia Madera, MD

## 2013-10-19 ENCOUNTER — Encounter: Payer: Self-pay | Admitting: Neurology

## 2013-10-19 ENCOUNTER — Telehealth: Payer: Self-pay | Admitting: Internal Medicine

## 2013-10-19 ENCOUNTER — Ambulatory Visit (INDEPENDENT_AMBULATORY_CARE_PROVIDER_SITE_OTHER): Payer: BC Managed Care – PPO | Admitting: Neurology

## 2013-10-19 VITALS — BP 133/77 | HR 58 | Ht 68.5 in | Wt 217.0 lb

## 2013-10-19 DIAGNOSIS — G4730 Sleep apnea, unspecified: Secondary | ICD-10-CM

## 2013-10-19 NOTE — Telephone Encounter (Signed)
New message     Seen Dr. Pearlean BrownieSethi today . Need to revisit the decision regarding not driving.

## 2013-10-19 NOTE — Progress Notes (Signed)
[facility] [address] [place]. [place] [id number] [phone]       OFFICE FOLLOW-UP NOTE  [name] Date of Birth:  Aug 16, 1946 Medical Record Number:  [id number]   HPI: [id number] year Caucasian male seen for first office followup visit for hospital admission on 09/27/13 for transient episode of bilateral blurred central vision with preserved peripheral vision for 20 minutes. He had trouble reading words as that is appeared to be broken. His peripheral vision remained intact. He denied any accompanying headache or other focal neurological symptoms. His had a normal neurological exam and CT scan on admission. MRI scan showed no evidence of acute stroke. Lipid profile is significant for total cholesterol of 180, triglycerides 97, HDL 64 and LDL 97 mg percent. Carotid Doppler showed no significant extracranial stenosis. EKG and telemetry monitoring showed normal sinus rhythm. 2D echocardiogram showed normal ejection fraction without corrects also involves him. He was started on aspirin for stroke prevention and Y. strict control of hypertension and lipids. He states his done well since discharge and has not had any recurrent stroke or TIA symptoms. He had an episode of possible micturition syncope few weeks ago for which he was hospitalized. He was felt to be  On a diuretic and orthostatic and this was discontinued. Dr. Graciela HusbandsKlein has placed external cardiac event monitor for 2 weeks. He did have one event for which he pressed the button but analysis is yet pending. He does have remote history of vasovagal syncope in childhood. The patient's wife is concerned that he may have sleep apnea as she has noticed that he quits breathing in his sleep. He does have restless sleep and does feel tired during the day. He does snore also. He has never been evaluated for sleep apnea.  ROS:   14 system review of systems is positive for blurred central vision and all other systems negative PMH:  Past  Medical History  Diagnosis Date  . Hyperlipidemia   . Asthma   . History of actinic keratosis   . Arthritis   . Headache(784.0)     Social History:  History   Social History  . Marital Status: Married    Spouse Name: N/A    Number of Children: 3  . Years of Education: law school   Occupational History  . [employer] title    Social History Main Topics  . Smoking status: Former Games developermoker  . Smokeless tobacco: Not on file  . Alcohol Use: Yes     Comment: 15 to 20 drinks per week  . Drug Use: No  . Sexual Activity: Not on file   Other Topics Concern  . Not on file   Social History Narrative  . No narrative on file    Medications:   Current Outpatient Prescriptions on File Prior to Visit  Medication Sig Dispense Refill  . albuterol (VENTOLIN HFA) 108 (90 BASE) MCG/ACT inhaler Inhale 2 puffs into the lungs every 6 (six) hours as needed for wheezing.  18 each  11  . aspirin 81 MG tablet Take 81 mg by mouth daily.      . budesonide (PULMICORT) 180 MCG/ACT inhaler Inhale 2 puffs into the lungs 2 (two) times daily.      Marland Kitchen. levothyroxine (SYNTHROID, LEVOTHROID) 50 MCG tablet Take 1 tablet (50 mcg total) by mouth daily before breakfast.  90 tablet  3  . losartan (COZAAR) 50 MG tablet Take 1 tablet (50 mg total) by mouth daily.  30 tablet  0  . simvastatin (ZOCOR) 10 MG tablet Take 1 tablet (10 mg total) by mouth daily at 6 PM.  30 tablet  3  . [DISCONTINUED] hydrochlorothiazide (HYDRODIURIL) 25 MG tablet Take 1 tablet (25 mg total) by mouth daily.  30 tablet  3   No current facility-administered medications on file prior to visit.    Allergies:   Allergies  Allergen Reactions  . Flovent [Fluticasone] Other (See Comments)    "my voice got horse"  . Lovastatin     myalgias    Physical Exam General: well developed, well nourished, seated, in no evident distress Head: head normocephalic and atraumatic. Orohparynx benign Neck: supple with no carotid or supraclavicular  bruits Cardiovascular: regular rate and rhythm, no murmurs Musculoskeletal: no deformity Skin:  no rash/petichiae Vascular:  Normal pulses all extremities Filed Vitals:   10/19/13 1247  BP: 133/77  Pulse: 58   Neurologic Exam Mental Status: Awake and fully alert. Oriented to place and time. Recent and remote Ayala intact. Attention span, concentration and fund of knowledge appropriate. Mood and affect appropriate.  Cranial Nerves: Fundoscopic exam reveals sharp disc margins. Pupils equal, briskly reactive to light. Extraocular movements full without nystagmus. Visual fields full to confrontation. Hearing intact. Facial sensation intact. Face, tongue, palate moves normally and symmetrically.  Motor: Normal bulk and tone. Normal strength in all tested extremity muscles. Sensory.: intact to touch and pinprick and vibratory sensation.  Coordination: Rapid alternating movements normal in all extremities. Finger-to-nose and heel-to-shin performed accurately bilaterally. Gait and Station: Arises from chair without difficulty. Stance is normal. Gait demonstrates normal stride length and balance . Able to heel, toe and tandem walk without difficulty.  Reflexes: 1+ and symmetric. Toes downgoing.       ASSESSMENT:  68 year Male transient episode of decreased central vision in both eyes in February 2015 probably visual migraine episode with negative neurovascular workup. Vascular structures only of mild hyperlipidemia and family history of stroke    PLAN: I had a long discussion with the patient with regards to his symptoms of transient visual obscurations and discussed possibility of visual migraine in view of negative neurovascular workup. Since he's had only a single episode I do not believe specific treatment options are indicated at current time. I advised him to call me if he is recurrent or new symptoms. Continue aspirin for stroke prevention. Check polysomnogram for sleep apnea. Return for  followup in 3 months with Levester Fresh, NP or. call earlier if necessary    Note: This document was prepared with digital dictation and possible smart phrase technology. Any transcriptional errors that result from this process are unintentional

## 2013-10-19 NOTE — Patient Instructions (Signed)
I had a long discussion with the patient with regards to his symptoms of transient visual obscurations and discussed possibility of visual migraine in view of negative neurovascular workup. Since he's had only a single episode I do not believe specific treatment options are indicated at current time. I advised him to call me if he is recurrent or new symptoms. Continue aspirin for stroke prevention. Check polysomnogram for sleep apnea. Return for followup in 3 months with Levester FreshLynn Lamb, NP or. call earlier if necessary

## 2013-10-20 ENCOUNTER — Other Ambulatory Visit: Payer: Self-pay | Admitting: *Deleted

## 2013-10-20 ENCOUNTER — Ambulatory Visit (INDEPENDENT_AMBULATORY_CARE_PROVIDER_SITE_OTHER): Payer: BC Managed Care – PPO | Admitting: Family Medicine

## 2013-10-20 VITALS — BP 120/72 | HR 93 | Temp 98.3°F | Resp 18 | Ht 67.75 in | Wt 215.2 lb

## 2013-10-20 DIAGNOSIS — I10 Essential (primary) hypertension: Secondary | ICD-10-CM

## 2013-10-20 DIAGNOSIS — R55 Syncope and collapse: Secondary | ICD-10-CM

## 2013-10-20 MED ORDER — LOSARTAN POTASSIUM 50 MG PO TABS: 50.0000 mg | ORAL_TABLET | Freq: Every day | ORAL | Status: AC

## 2013-10-20 MED ORDER — LOSARTAN POTASSIUM 50 MG PO TABS: 50.0000 mg | ORAL_TABLET | Freq: Every day | ORAL | Status: DC

## 2013-10-20 NOTE — Progress Notes (Signed)
This chart was scribed for Scott SidleKurt Lauenstein, MD by Arlan OrganAshley Ayala, [facility]. This patient was seen in room 12 and the patient's care was started 5:29 PM.    Patient Name: Scott MemorySamuel E Deroche Date of Birth: 03-Aug-1946 Medical Record Number: [id number] Gender: male Date of Encounter: 10/20/2013  Chief Complaint: Follow-up and Medication Refill   History of Present Illness:  Scott Ayala is a 68 y.o. very pleasant male patient who presents with the following:  Pt presents for a follow up today from a hospital stay on 2/9 today for bilateral blurred central vision with preserved peripheral vision for 20 minutes. Pt states he was told he had a possible TIA, and was started on Simvastatin 10 mg and Hydrochlorothiazide during his initial hospital visit. He reports recent malaise, dizziness and possibly the start of a cold 10/01/13. He also reports having a syncopal episode with associated visual disturbances. As a result, pt sustained a laceration to his chin which required stitches. After this episode, he states his BP medication was changed to Losartan 50 mg. At this time he denies any fever, chills, or abnormal symptoms secondary to his recent episode. Pt reports checking his BP readings at home, and reports readings between 120/80 and 150/92. He has a PMHx of hyperlipidemia, asthma, arthritis, and HA. No other complaints this visit.  Pt has also requested a refill for his Losartan 50 mg prescription.   Patient Active Problem List   Diagnosis Date Noted   Essential hypertension, benign 09/28/2013   Bilateral carotid artery stenosis 0-39% 09/28/2013   Visual distortion 09/27/2013   TIA (transient ischemic attack) 09/27/2013   Asthma 09/05/2011   History of actinic keratosis    Hyperlipidemia    NONSPECIFIC ABNORMAL FINDING IN STOOL CONTENTS 04/25/2010   PERSONAL HISTORY OF COLONIC POLYPS 04/25/2010   Past Medical History  Diagnosis Date   Hyperlipidemia     Asthma    History of actinic keratosis    Arthritis    Headache(784.0)    Past Surgical History  Procedure Laterality Date   Vasectomy     History  Substance Use Topics   Smoking status: Former Smoker   Smokeless tobacco: Not on file   Alcohol Use: Yes     Comment: 15 to 20 drinks per week   Family History  Problem Relation Age of Onset   Stroke Mother    Stroke Father    Hypertension Father    Heart disease Father    Heart disease Sister    Heart disease Brother    Heart disease Brother    Hyperlipidemia Brother    Allergies  Allergen Reactions   Flovent [Fluticasone] Other (See Comments)    "my voice got hoarse"   Lovastatin     myalgias    Medication list has been reviewed and updated.  Current Outpatient Prescriptions on File Prior to Visit  Medication Sig Dispense Refill   albuterol (VENTOLIN HFA) 108 (90 BASE) MCG/ACT inhaler Inhale 2 puffs into the lungs every 6 (six) hours as needed for wheezing.  18 each  11   aspirin 81 MG tablet Take 81 mg by mouth daily.       budesonide (PULMICORT) 180 MCG/ACT inhaler Inhale 2 puffs into the lungs 2 (two) times daily.       levothyroxine (SYNTHROID, LEVOTHROID) 50 MCG tablet Take 1 tablet (50 mcg total) by mouth daily before breakfast.  90 tablet  3   losartan (COZAAR) 50  MG tablet Take 1 tablet (50 mg total) by mouth daily.  30 tablet  0   simvastatin (ZOCOR) 10 MG tablet Take 1 tablet (10 mg total) by mouth daily at 6 PM.  30 tablet  3   [DISCONTINUED] hydrochlorothiazide (HYDRODIURIL) 25 MG tablet Take 1 tablet (25 mg total) by mouth daily.  30 tablet  3   No current facility-administered medications on file prior to visit.    Review of Systems:    Physical Examination: Filed Vitals:   10/20/13 1702  BP: 120/72  Pulse: 93  Temp: 98.3 F (36.8 C)  Resp: 18   @vitals2 @ Body mass index is 32.96 kg/(m^2). Ideal Body Weight: @FLOWAMB ([id number])@ Blood pressure is 136/80 both  standing and sitting Laceration over the chin is healing with some induration and swelling Patient is slightly flushed his cheeks but says that his hot in the room Neck supple Heart is regular without murmur Chest is clear   EKG / Labs / Xrays: None available at time of encounter  Assessment and Plan: I believe the patient had a migraine episode that was the initial cause of his visual disturbance. He was put on hydrochlorothiazide and subsequently had a vagal episode resulting in his hospital admission overnight. At this point I don't see any reason to keep him from driving. He's been exercising without problem.  Is told patient he can return to his normal activities. I told him that because he's had problems with lovastatin in the past that he can stop the simvastatin but he should continue the losartan.  I personally performed the services described in this documentation, which was scribed in my presence. The recorded information has been reviewed and is accurate.     Scott Sidle, MD

## 2013-10-20 NOTE — Telephone Encounter (Signed)
Pt returning my call. We discussed recent medical issues he has been experiencing and why monitor was ordered. I explained to the patient that we are only looking at monitor results and sending findings to ordering physician. I further explained that Dr. Graciela HusbandsKlein is not following the patient and has never seen the patient - and for this reason he would not be the doctor to determine patient's driving privileges.  However, if monitor results showed anything that we would need to follow him on then he would be contacted, along with his ordering physician, about the need for referral.  Advised him to talk with Dr. Pearlean BrownieSethi about this. Pt very thankful for information and help, and agreeable to call Sethi/PCP.

## 2013-10-21 ENCOUNTER — Telehealth: Payer: Self-pay | Admitting: Neurology

## 2013-10-21 DIAGNOSIS — E669 Obesity, unspecified: Secondary | ICD-10-CM

## 2013-10-21 DIAGNOSIS — G4733 Obstructive sleep apnea (adult) (pediatric): Secondary | ICD-10-CM

## 2013-10-21 NOTE — Telephone Encounter (Signed)
Dr. Pearlean BrownieSethi, refers patient for attended sleep study.  Height: 5'8.5  Weight: 217 lbs.  BMI: 32.51  Past Medical History: Hyperlipidemia  Asthma  History of actinic keratosis  Arthritis  Headache(784.0)    Sleep Symptoms:  The patient's wife is concerned that he may have sleep apnea as she has noticed that he quits breathing in his sleep. He does have restless sleep and does feel tired during the day. He does snore also. He has never been evaluated for sleep apnea.   Medications:  Albuterol Sulfate (Aero Soln) PROVENTIL HFA;VENTOLIN HFA 108 (90 BASE) MCG/ACT Inhale 2 puffs into the lungs every 6 (six) hours as needed for wheezing. Aspirin (Tab) aspirin 81 MG Take 81 mg by mouth daily. Budesonide (Aerosol Powder, Breath Activtivatede) PULMICORT 180 MCG/ACT Inhale 2 puffs into the lungs 2 (two) times daily. Levothyroxine Sodium (Tab) SYNTHROID, LEVOTHROID 50 MCG Take 1 tablet (50 mcg total) by mouth daily before breakfast. Losartan Potassium (Tab) COZAAR 50 MG Take 1 tablet (50 mg total) by mouth daily. Simvastatin (Tab) ZOCOR 10 MG Take 1 tablet (10 mg total) by mouth daily at 6 PM.   Insurance: [employer]  Dr. Marlis EdelsonSethi's Assessment and Plan:  I had a long discussion with the patient with regards to his symptoms of transient visual obscurations and discussed possibility of visual migraine in view of negative neurovascular workup. Since he's had only a single episode I do not believe specific treatment options are indicated at current time. I advised him to call me if he is recurrent or new symptoms. Continue aspirin for stroke prevention. Check polysomnogram for sleep apnea. Return for followup in 3 months with Levester FreshLynn Lamb, NP or. call earlier if necessary    Please review patient information and submit instructions for scheduling and orders for sleep technologist.  Thank you!

## 2013-10-27 NOTE — Telephone Encounter (Signed)
This patient is referred by Dr. Pearlean BrownieSethi for a sleep study due to a report of snoring, witnessed breathing pauses while asleep as reported by wife, daytime somnolence, obesity. I will order a split-night sleep study and see the patient in sleep medicine consultation afterwards.

## 2013-11-04 ENCOUNTER — Other Ambulatory Visit: Payer: Self-pay | Admitting: Family Medicine

## 2013-11-04 ENCOUNTER — Telehealth: Payer: Self-pay

## 2013-11-04 DIAGNOSIS — N529 Male erectile dysfunction, unspecified: Secondary | ICD-10-CM

## 2013-11-04 MED ORDER — SILDENAFIL CITRATE 100 MG PO TABS: 50.0000 mg | ORAL_TABLET | Freq: Every day | ORAL | Status: AC | PRN

## 2013-11-04 NOTE — Telephone Encounter (Signed)
Dr L   Patient is requesting his test results from being monitored  Also, requesting Viagra  [phone]

## 2013-11-07 NOTE — Telephone Encounter (Signed)
Patient notified viagra sent to pharmacy. He is wanting to know results from heart monitor/event monitor?

## 2013-11-08 NOTE — Telephone Encounter (Signed)
Per Dr. Elbert EwingsL, all of this looked normal. LMOM of info and to CB w

## 2013-11-30 ENCOUNTER — Ambulatory Visit (INDEPENDENT_AMBULATORY_CARE_PROVIDER_SITE_OTHER): Payer: BC Managed Care – PPO

## 2013-11-30 DIAGNOSIS — E669 Obesity, unspecified: Secondary | ICD-10-CM

## 2013-11-30 DIAGNOSIS — G479 Sleep disorder, unspecified: Secondary | ICD-10-CM

## 2013-11-30 DIAGNOSIS — G4733 Obstructive sleep apnea (adult) (pediatric): Secondary | ICD-10-CM

## 2013-11-30 DIAGNOSIS — G4761 Periodic limb movement disorder: Secondary | ICD-10-CM

## 2013-12-17 ENCOUNTER — Telehealth: Payer: Self-pay | Admitting: Neurology

## 2013-12-17 DIAGNOSIS — R9431 Abnormal electrocardiogram [ECG] [EKG]: Secondary | ICD-10-CM

## 2013-12-17 DIAGNOSIS — G4761 Periodic limb movement disorder: Secondary | ICD-10-CM

## 2013-12-17 DIAGNOSIS — G4733 Obstructive sleep apnea (adult) (pediatric): Secondary | ICD-10-CM

## 2013-12-17 NOTE — Telephone Encounter (Signed)
Please call and notify the patient that the recent sleep study did confirm the diagnosis of obstructive sleep apnea and that I recommend treatment for this in the form of CPAP. This will require a repeat sleep study for proper titration and mask fitting. Please explain to patient and arrange for a CPAP titration study. I have placed an order in the chart. Thanks, Mathew Postiglione, MD, PhD [facility]  

## 2013-12-20 ENCOUNTER — Encounter: Payer: Self-pay | Admitting: *Deleted

## 2013-12-20 NOTE — Telephone Encounter (Signed)
I called and spoke with the patient about his recent sleep study results. I informed the patient that the study did confirm the diagnosis of obstructive sleep apnea and that Dr. Frances FurbishAthar recommend treatment in the form of CPAP. I informed the patient that this will require a repeat study for proper titration and mask fitting. Patient has agreed to the date of January 25, 2014 at 8:00 pm and wants to be placed on the wait list. I will fax a copy of the report to Dr. Pearlean BrownieSethi and Dr. Milus GlazierLauenstein offices and mail a copy to the patient.

## 2013-12-21 ENCOUNTER — Telehealth: Payer: Self-pay

## 2013-12-21 NOTE — Telephone Encounter (Signed)
[facility] faxed sleep study results to Dr L who asked me to make sure pt knows the results and plans for f/up. I see in EPIC that [facility] has spoken to pt about the results and he has an appt for CPAP titration test on June 9.

## 2014-01-25 ENCOUNTER — Ambulatory Visit (INDEPENDENT_AMBULATORY_CARE_PROVIDER_SITE_OTHER): Payer: BC Managed Care – PPO | Admitting: Neurology

## 2014-01-25 DIAGNOSIS — G4733 Obstructive sleep apnea (adult) (pediatric): Secondary | ICD-10-CM

## 2014-01-25 DIAGNOSIS — G4761 Periodic limb movement disorder: Secondary | ICD-10-CM

## 2014-01-25 DIAGNOSIS — R9431 Abnormal electrocardiogram [ECG] [EKG]: Secondary | ICD-10-CM

## 2014-02-02 ENCOUNTER — Ambulatory Visit: Payer: BC Managed Care – PPO | Admitting: Nurse Practitioner

## 2014-02-04 ENCOUNTER — Telehealth: Payer: Self-pay | Admitting: Neurology

## 2014-02-04 DIAGNOSIS — G4733 Obstructive sleep apnea (adult) (pediatric): Secondary | ICD-10-CM

## 2014-02-04 NOTE — Telephone Encounter (Signed)
Please call and inform patient that I have entered an order for treatment with PAP. He did well during the latest sleep study with CPAP. We will, therefore, arrange for a machine for home use through a DME (durable medical equipment) company of His choice; and I will see the patient back in follow-up in about 6 weeks. Please also explain to the patient that I will be looking out for compliance data downloaded from the machine, which can be done remotely through a modem at times or stored on an SD card in the back of the machine. At the time of the followup appointment we will discuss sleep study results and how it is going with PAP treatment at home. Please advise patient to bring His machine at the time of the visit; at least for the first visit, even though this is cumbersome. Bringing the machine for every visit after that may not be needed, but often helps for the first visit. Please also make sure, the patient has a follow-up appointment with me in about 6 weeks from the setup date, thanks.   Saima Athar, MD, PhD [facility]  

## 2014-02-07 ENCOUNTER — Encounter: Payer: Self-pay | Admitting: *Deleted

## 2014-02-07 NOTE — Telephone Encounter (Signed)
I called and spoke with the patient about his recent sleep study results. I informed the patient that he did well on CPAP during the night of his study. Dr. Frances FurbishAthar recommend starting CPAP therapy at home. I will fax a copy of the report to Dr. Kenyon AnaKurt Lauenstein's office and mail a copy to the patient along with a follow up instruction letter.

## 2014-03-18 ENCOUNTER — Encounter: Payer: Self-pay | Admitting: Neurology

## 2014-03-29 NOTE — Progress Notes (Signed)
Quick Note:  I reviewed the patient's CPAP compliance data from 02/16/2014 to 03/17/2014, which is a total of 30 days, during which time the patient used CPAP every day except for 2 days. The average usage for all days was 5 hours and 15 minutes. The percent used days greater than 4 hours was 83 %, indicating very good compliance. The residual AHI was low at 0.5 per hour, indicating an adequate treatment pressure of 8 cwp with EPR of 1. Air leak from the mask was acceptable at 8.1 L per minute at the 95th percentile. I will review this data with the patient at the next office visit, which is currently not scheduled. We will get in touch with the patient regarding his followup appointment.  Huston FoleySaima Brynn Mulgrew, MD, PhD [facility]   ______

## 2014-04-19 ENCOUNTER — Encounter: Payer: Self-pay | Admitting: Family Medicine

## 2014-05-04 NOTE — Progress Notes (Signed)
Quick Note:  I reviewed the patient's CPAP compliance data from 03/21/2014 to 04/19/2014, which is a total of 30 days, during which time the patient used CPAP every day except for 1 day. The average usage for all days was 6 hours and 19 minutes. The percent used days greater than 4 hours was 90 %, indicating excellent compliance. The residual AHI was 0.5 per hour, indicating an adequate treatment pressure of 8 cwp with EPR of 1. Air leak from the mask was very low at 3.1 L per minute for the 95th percentile. I will review this data with the patient at the next office visit, which is currently not routinely scheduled. We will get in touch with the patient to schedule his followup appointment.  Huston Foley, MD, PhD [facility]   ______

## 2014-08-01 ENCOUNTER — Other Ambulatory Visit: Payer: Self-pay | Admitting: Family Medicine

## 2014-12-03 IMAGING — US US ABDOMEN COMPLETE
1 series · 13 of 25 positions shown · non-contrast
Comparison: None.

CLINICAL DATA: Abdominal fullness. Hypertension. Elevated
cholesterol.

EXAM:
ULTRASOUND ABDOMEN COMPLETE

[Series 1: us abdomen complete · 0.43mm/px · 13 of 96 slices shown]
[im 1/96]
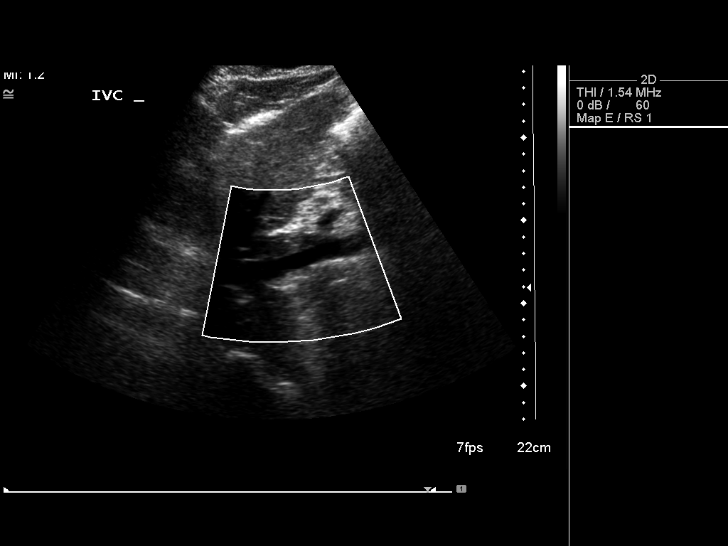
[im 8/96]
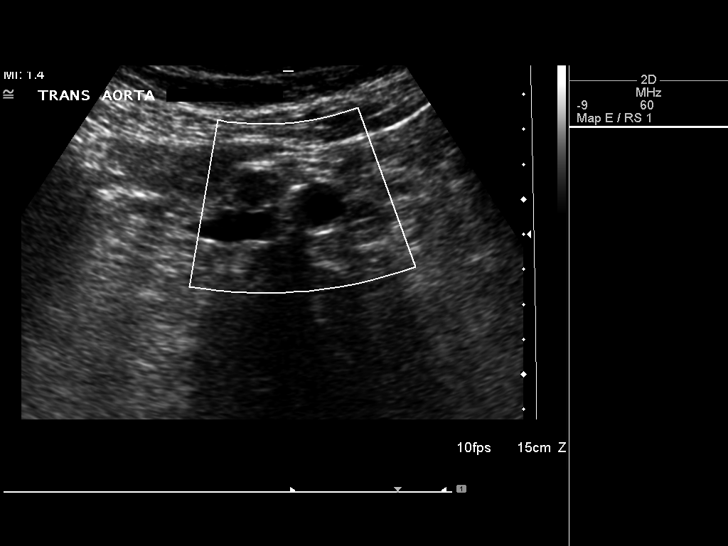
[im 16/96]
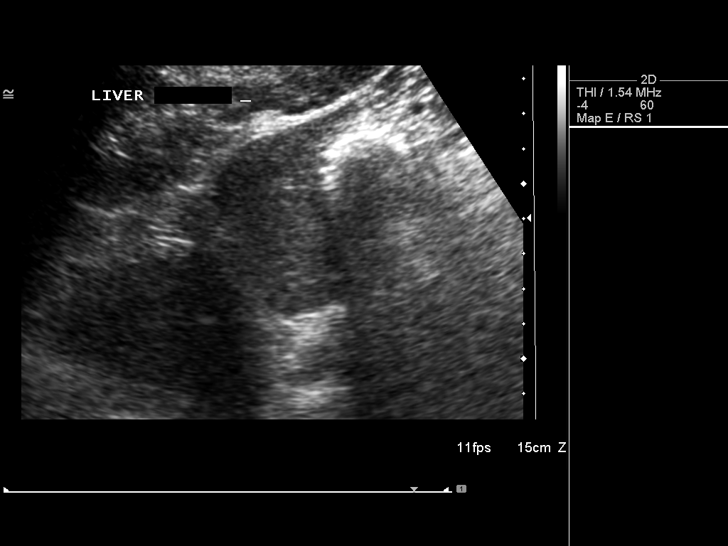
[im 24/96]
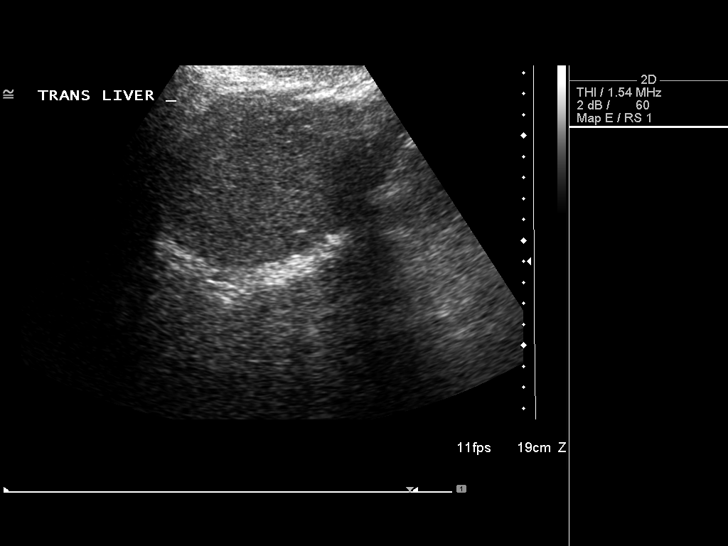
[im 32/96]
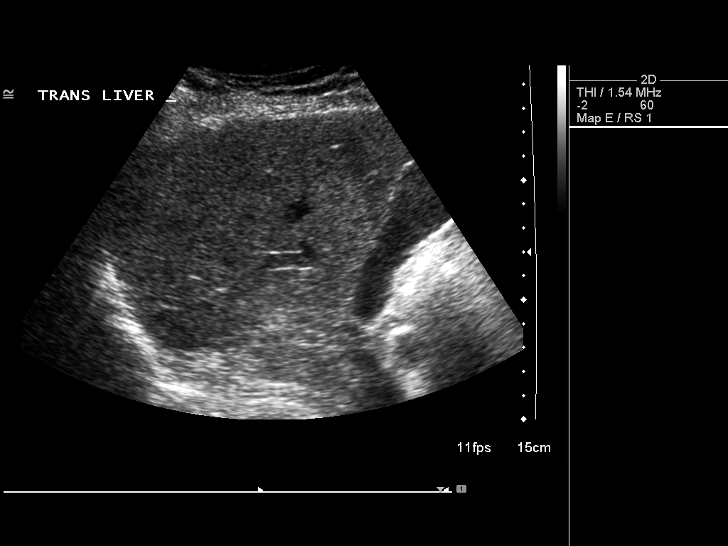
[im 40/96]
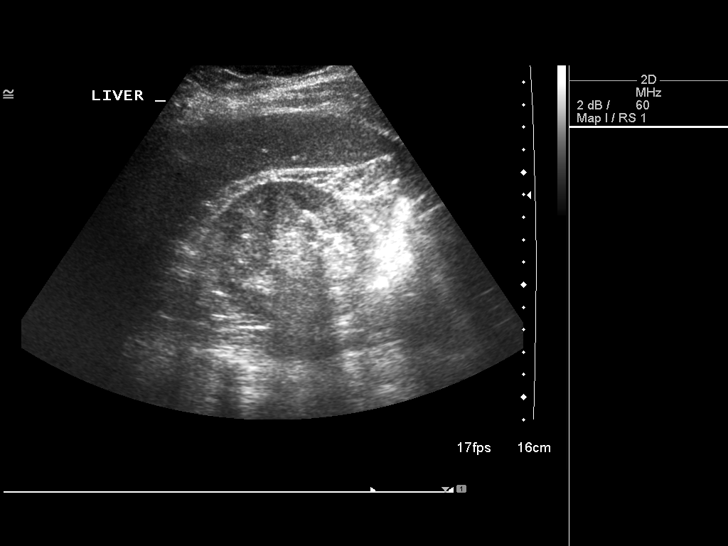
[im 48/96]
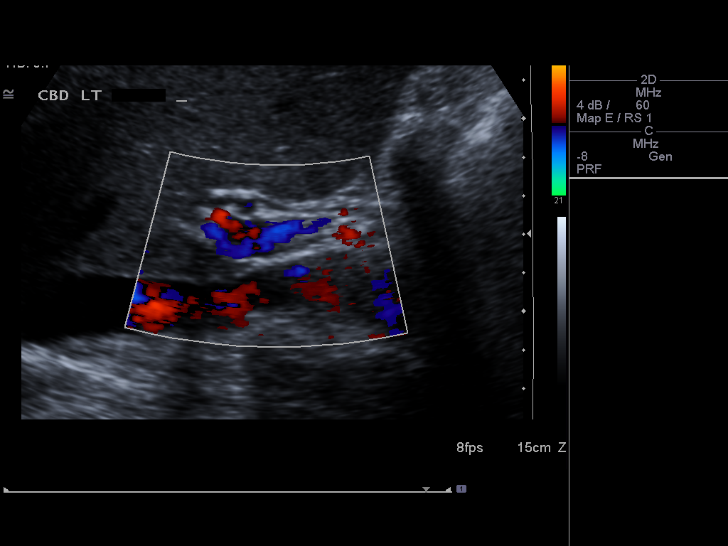
[im 56/96]
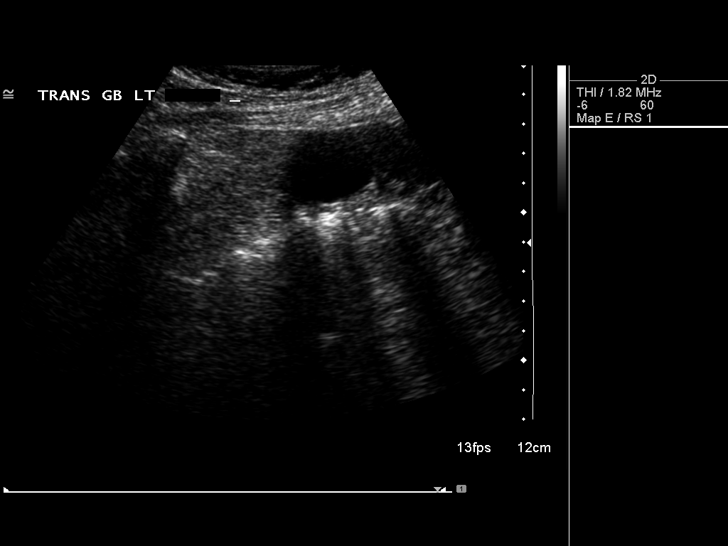
[im 64/96]
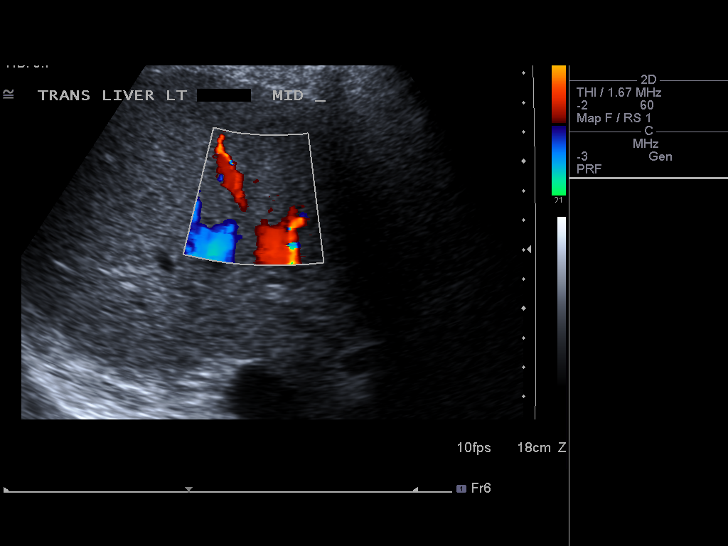
[im 72/96]
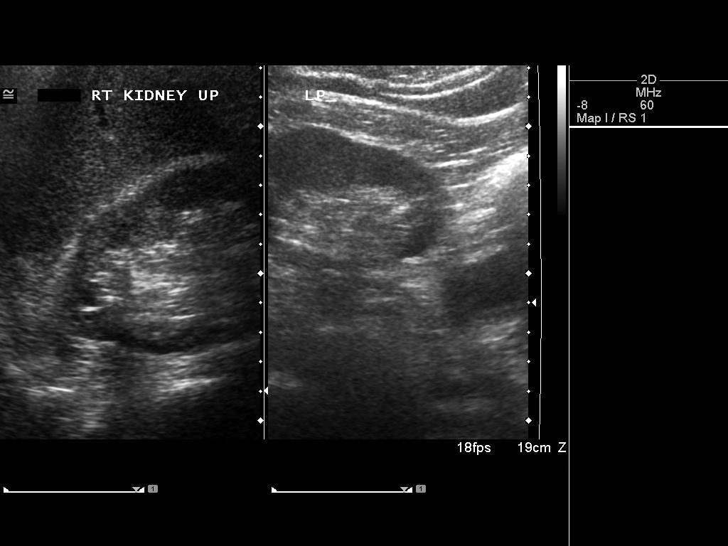
[im 80/96]
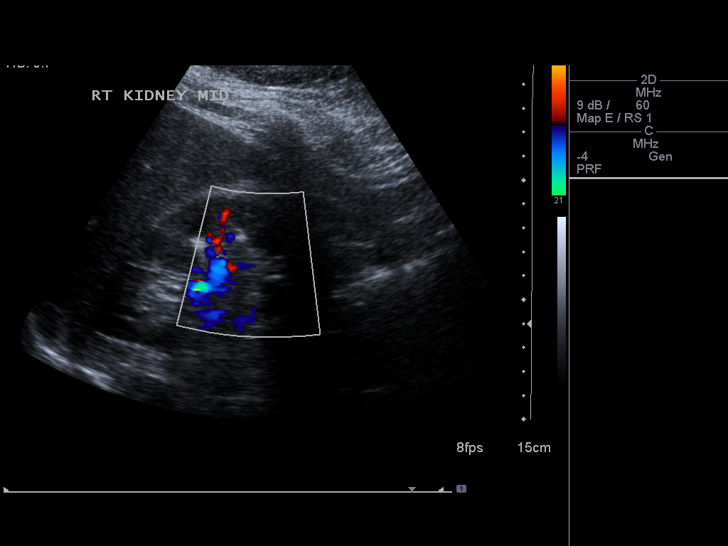
[im 88/96]
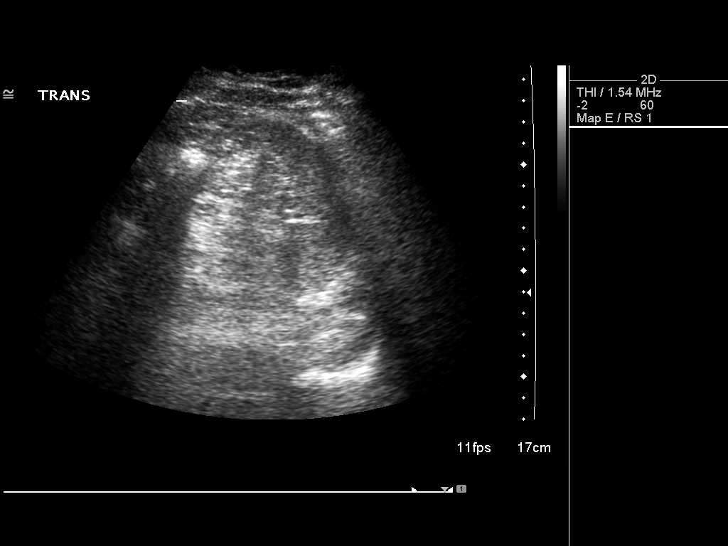
[im 96/96]
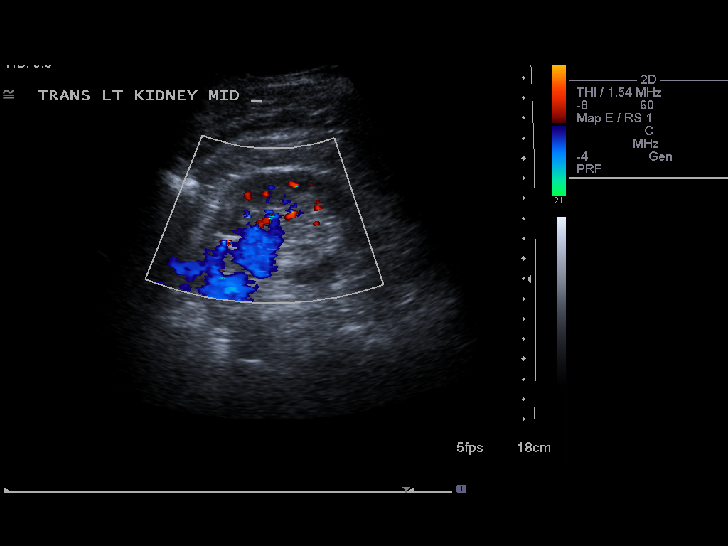

[13 of 25 positions shown; findings below may reference images not displayed]

FINDINGS: Gallbladder:

No gallstones or wall thickening visualized. No sonographic Murphy
sign noted.

Common bile duct:

Diameter: Normal, 2 mm.

Liver:

Relative hyper echogenicity within the central liver, anterior to
the portal vein. This measures 2.1 cm maximally.

IVC:

No abnormality visualized.

Pancreas:

Visualized portion unremarkable.

Spleen:

Size and appearance within normal limits.

Right Kidney:

Length: 11.2 cm.. Echogenic shadowing foci within the right renal
collecting system are suspicious for nonobstructive calculi. These
measure 6 and 4 mm.

Left Kidney:

Length: 11.6 cm..  No hydronephrosis.

Abdominal aorta:

No evidence of abdominal aortic aneurysm. Aortic atherosclerosis.
Mildly dilated common iliac arteries bilaterally. 1.5 cm on the left
and 1.4 cm on the right.

Other findings:

No ascites.
IMPRESSION: 1.  No acute process or explanation for abdominal fullness.
2. Probable right nephrolithiasis, nonobstructive.
3. Focus of hyper echogenicity within the central liver. Given its
location, favored to represent focal steatosis. If there is any
history of primary malignancy, further characterization with pre and
post contrast abdominal MRI is recommended. If no such history,
potential clinical strategies include followup with ultrasound at 3
months versus MRI.
4. Atherosclerotic abdominal aorta with mild dilatation of the
common iliac arteries bilaterally.

## 2015-01-25 IMAGING — CR DG CHEST 2V
2 series · 2 of 2 positions shown · non-contrast
Comparison: None.

CLINICAL DATA: Stroke, history asthma, former smoker

EXAM:
CHEST  2 VIEW

[w chest pa]
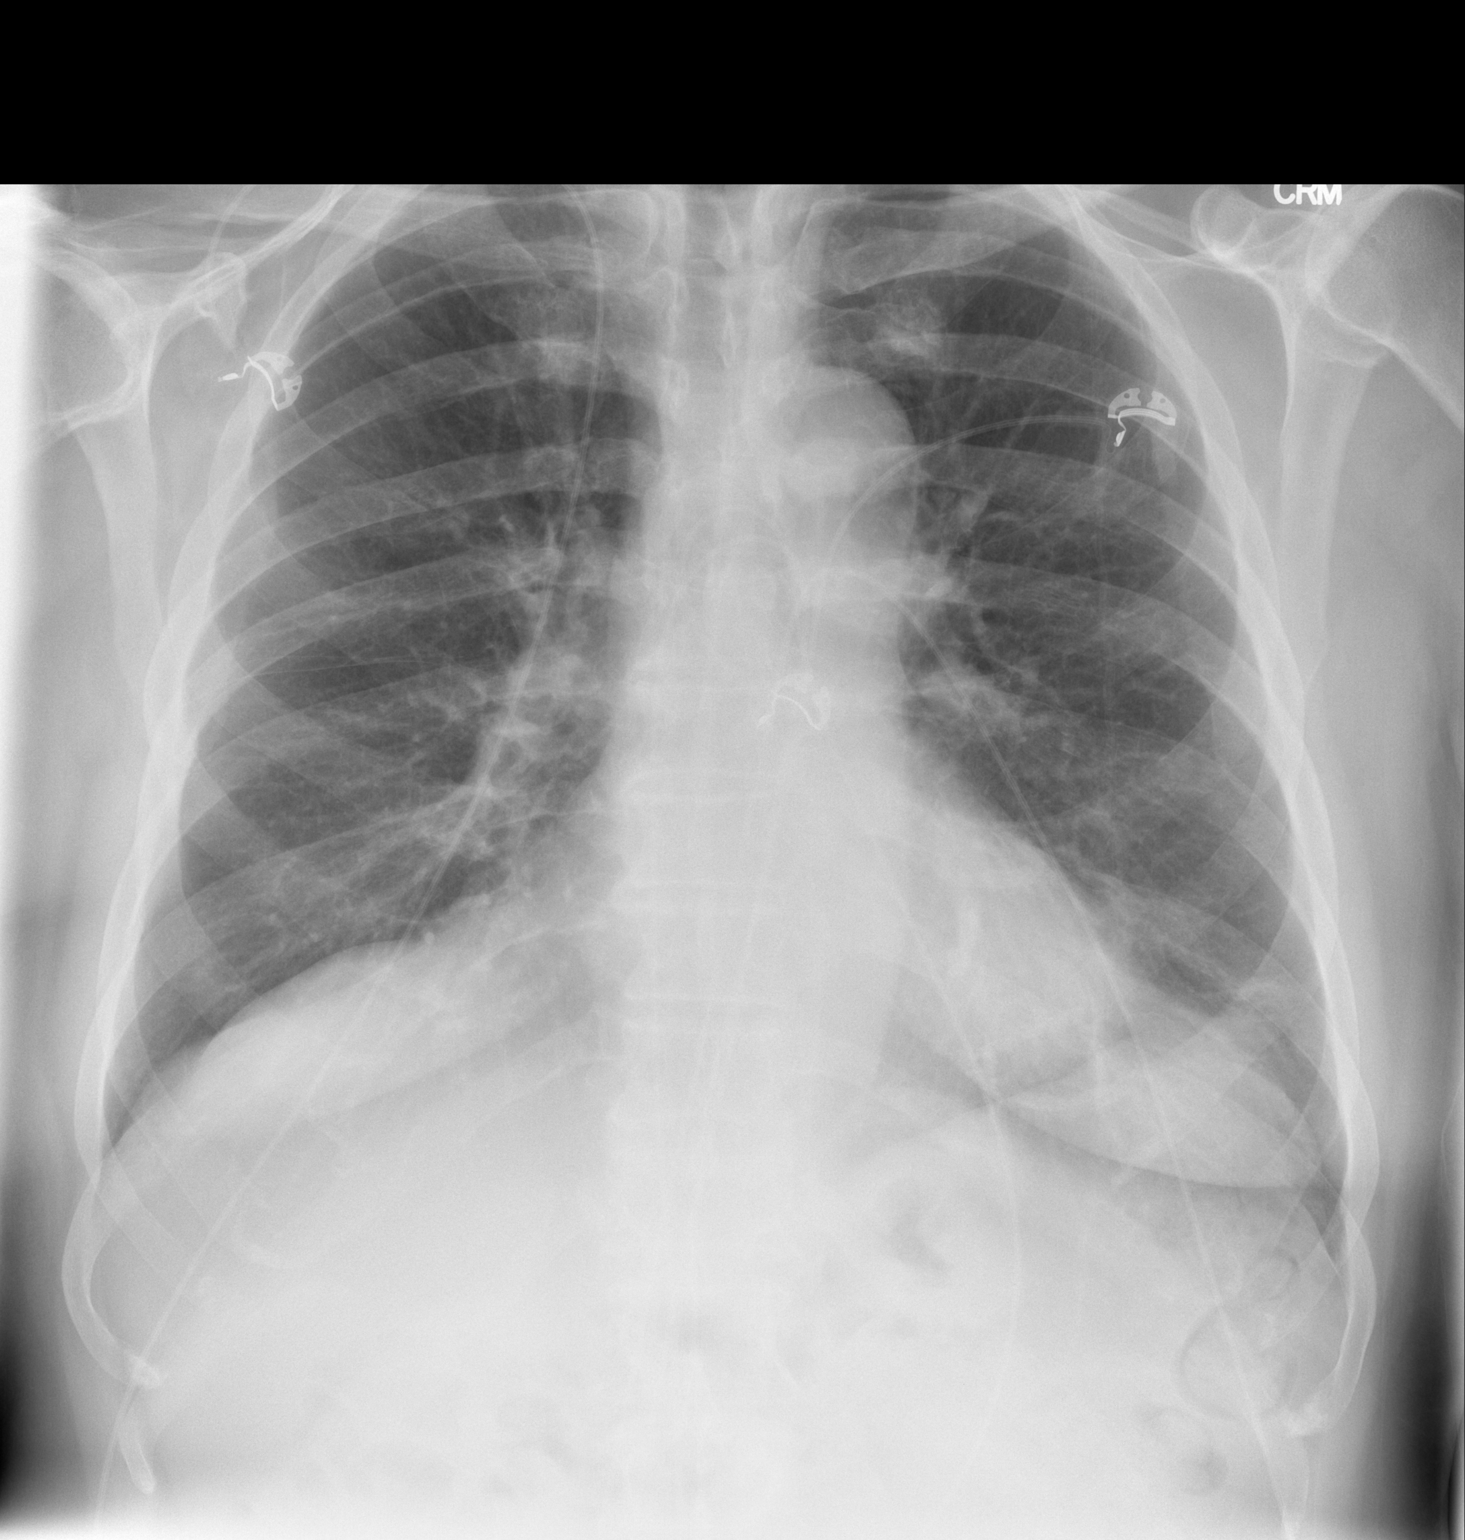

[w chest lat]
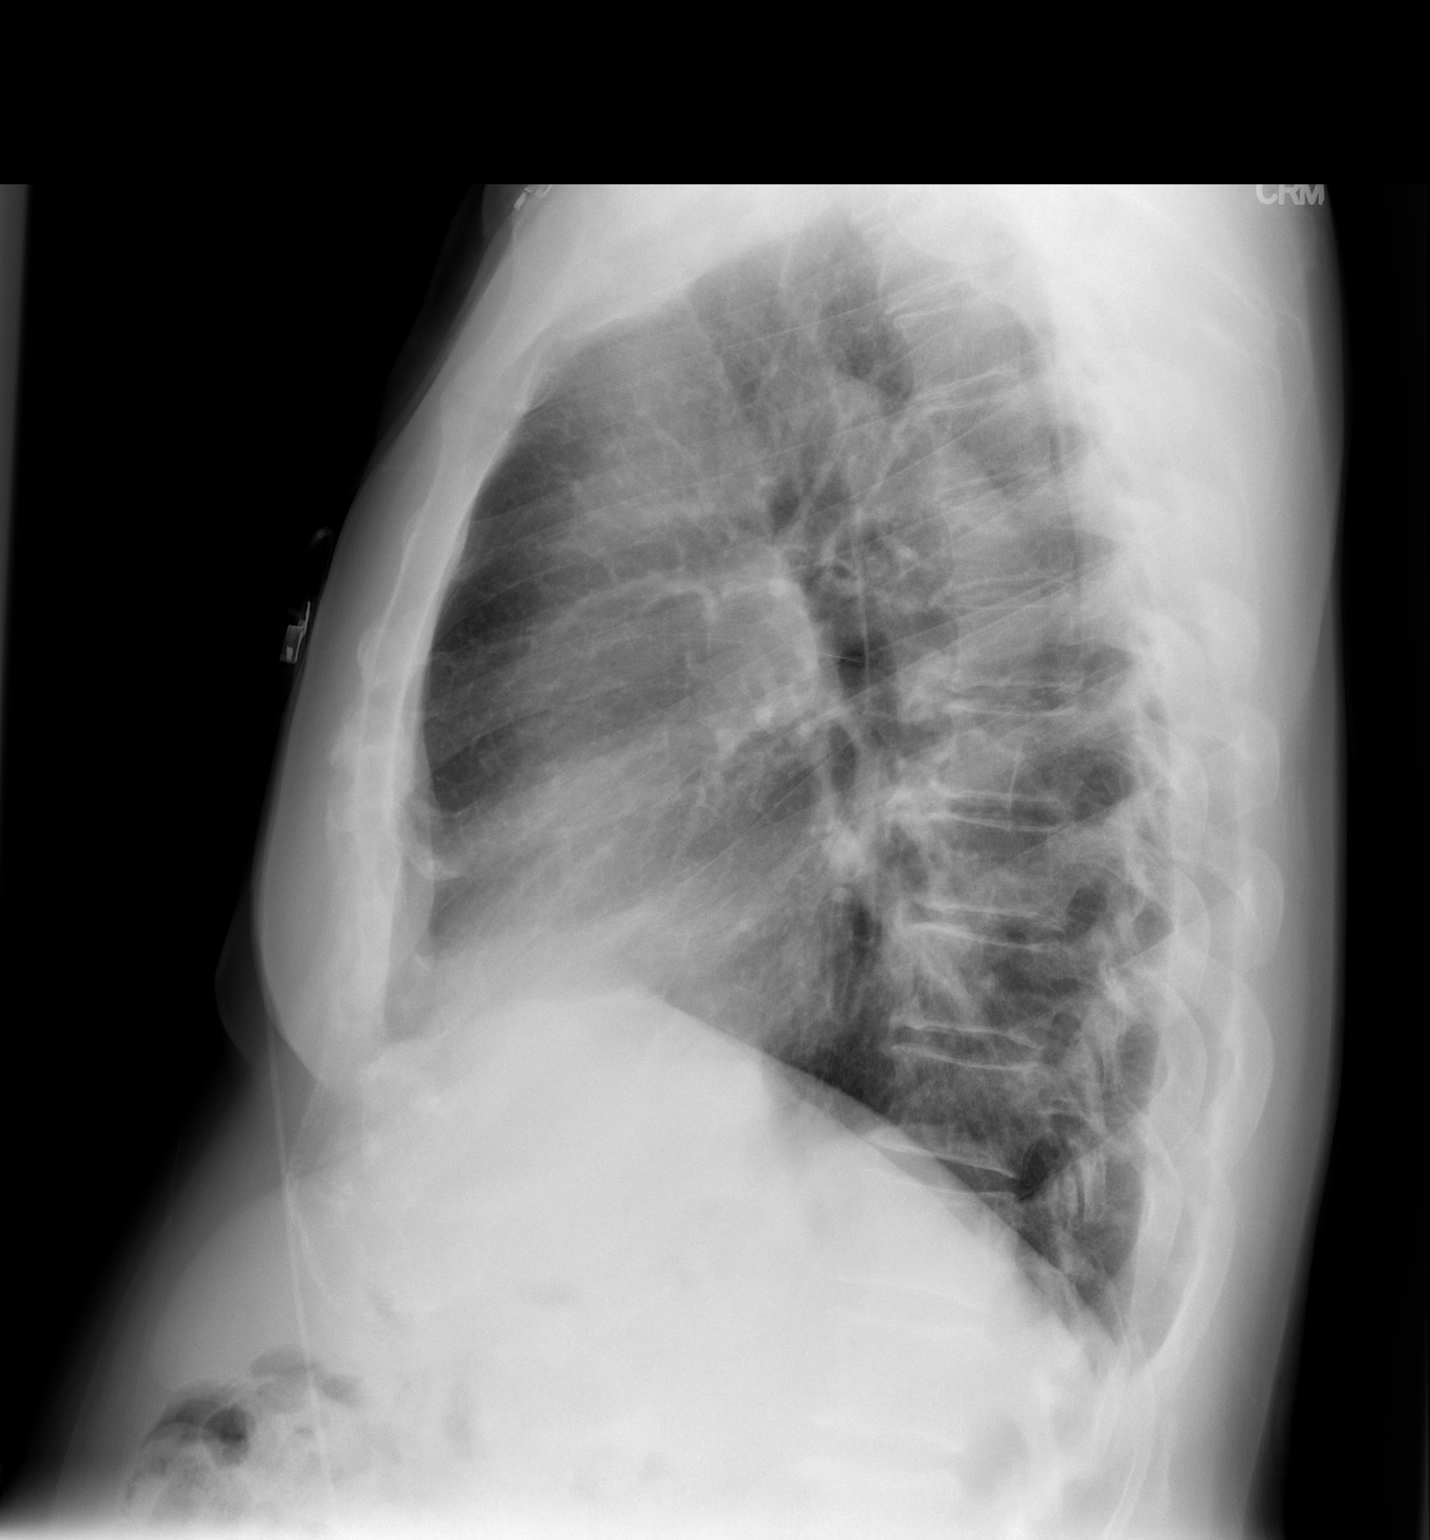

[2 of 2 positions shown; findings below may reference images not displayed]

FINDINGS: Borderline enlargement of cardiac silhouette.
Mediastinal contours and pulmonary vascularity normal.

Lungs slightly hyperinflated but clear.

No pleural effusion or pneumothorax.

Bones unremarkable.
IMPRESSION: Borderline enlargement of cardiac silhouette.

No acute abnormalities.

## 2015-01-28 IMAGING — CT CT MAXILLOFACIAL W/O CM
1 series · 1 of 2 positions shown · non-contrast
Comparison: MR HEAD W/O CM dated 09/27/2013; MR MRA HEAD W/O CM dated
09/27/2013; CT HEAD W/O CM dated 09/27/2013

CLINICAL DATA: Severe headache, chin laceration

EXAM:
CT HEAD WITHOUT CONTRAST
CT MAXILLOFACIAL WITHOUT CONTRAST
TECHNIQUE: Multidetector CT imaging of the head and maxillofacial structures
were performed using the standard protocol without intravenous
contrast. Multiplanar CT image reconstructions of the maxillofacial
structures were also generated.

[Series 1: scout · coronal · 0.6mm · 0.98mm/px · 1 of 2 slices shown]
[im 2/2]
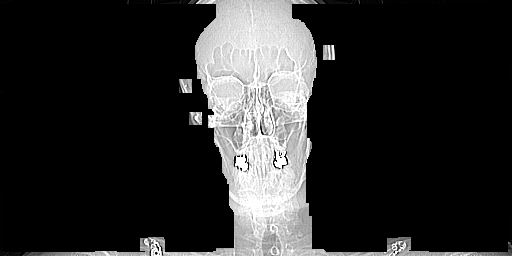

[1 of 2 positions shown; findings below may reference images not displayed]

FINDINGS: CT HEAD FINDINGS

There is no evidence of mass effect, midline shift or extra-axial
fluid collections. There is no evidence of a space-occupying lesion
or intracranial hemorrhage. There is no evidence of a cortical-based
area of acute infarction. There is periventricular white matter low
attenuation likely secondary to microangiopathy. There is
generalized cerebral atrophy.

The ventricles and sulci are appropriate for the patient's age. The
basal cisterns are patent.

Visualized portions of the orbits are unremarkable. The visualized
portions of the paranasal sinuses and mastoid air cells are
unremarkable.

The osseous structures are unremarkable.

CT MAXILLOFACIAL FINDINGS

There is a midline chin laceration. There is a small locular of
subcutaneous emphysema.

The globes are intact. The orbital walls are intact. The orbital
floors are intact. The maxilla is intact. The mandible is intact.
The zygomatic arches are intact. The nasal septum is midline. There
is no nasal bone fracture. The temporomandibular joints are normal.

The paranasal sinuses are clear. The visualized portions of the
mastoid sinuses are well aerated.

On the sagittal reformatted images there is a mild broad-based disc
bulge at C3-4. Partially visualized is degenerative disc disease and
bilateral uncovertebral degenerative changes with foraminal stenosis
at C5-6.
IMPRESSION: 1. No acute intracranial pathology.
2. No acute osseous injury the maxillofacial bones.
3. Partially visualized is cervical spondylosis as described above.

## 2015-03-24 ENCOUNTER — Encounter: Payer: Self-pay | Admitting: Gastroenterology

## 2015-09-07 ENCOUNTER — Encounter: Payer: Self-pay | Admitting: Gastroenterology
